# Patient Record
Sex: Male | Born: 1990 | Race: White | Hispanic: No | Marital: Married | Smoking: Never smoker
Health system: Southern US, Community
[De-identification: ages and names within clinical notes are randomized; demographics above are authoritative.]

## PROBLEM LIST (undated history)

## (undated) HISTORY — PX: TONSILLECTOMY: SUR1361

---
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
## 2013-12-11 ENCOUNTER — Emergency Department: Payer: Self-pay | Admitting: Emergency Medicine

## 2016-02-26 ENCOUNTER — Emergency Department: Payer: Self-pay

## 2016-02-26 ENCOUNTER — Emergency Department
Admission: EM | Admit: 2016-02-26 | Discharge: 2016-02-26 | Disposition: A | Discharge status: Home / Self Care | Payer: Self-pay | Attending: Emergency Medicine | Admitting: Emergency Medicine

## 2016-02-26 DIAGNOSIS — M25511 Pain in right shoulder: Secondary | ICD-10-CM | POA: Insufficient documentation

## 2016-02-26 DIAGNOSIS — G8929 Other chronic pain: Secondary | ICD-10-CM | POA: Insufficient documentation

## 2016-02-26 MED ORDER — IBUPROFEN 600 MG PO TABS: 600.0000 mg | ORAL_TABLET | Freq: Three times a day (TID) | ORAL | Status: DC | PRN

## 2016-02-26 NOTE — ED Notes (Signed)
Went to room to give patient his discharge instructions and patient, pt significant other and his belongings were all gone from the room.  Pt did not sign for discharge.

## 2016-02-26 NOTE — ED Notes (Signed)
Pt arrives to ER c/o right sided shoulder pain X 1 week. States hx of shoulder pain X 2 years ago and reinjured shoulder when picking something up at work. Pt states that he does not want to file workers comp.  Denies CP or SOB. Pt alert and oriented X4, active, cooperative, pt in NAD. RR even and unlabored, color WNL.

## 2016-02-26 NOTE — Discharge Instructions (Signed)
Follow-up with Dr. Martha ClanKrasinski for further evaluation of your right shoulder pain. Begin taking ibuprofen 3 times a day with food. You may also use ice or heat to your shoulder as needed for discomfort.

## 2016-02-26 NOTE — ED Provider Notes (Signed)
[facility] Emergency Department Provider Note  ____________________________________________  Time seen: Approximately 1:30 PM  I have reviewed the triage vital signs and the nursing notes.   HISTORY  Chief Complaint Shoulder Injury   HPI [name] is a 25 y.o. male history complaint of right-sided shoulder pain for 1 week. Patient states he has history of possible injury to his shoulder 2 years ago when he tried to do a pushup. He has not seen any orthopedist for this problem until today when he believes that he really injured his shoulder when he picked up something at work. Patient states that he does not want to file Workmen's Comp. He has not taken any over-the-counter medication for his shoulder pain. He states that the pain is "inside his shoulder". Patient has continued to use his arm without restriction. He denies any paresthesias and does not experience any increased pain with range of motion. His able to make a complete fist without any pain. Currently he rates his pain as 5 out of 10.   History reviewed. No pertinent past medical history.  There are no active problems to display for this patient.   History reviewed. No pertinent past surgical history.  Current Outpatient Rx  Name  Route  Sig  Dispense  Refill  . ibuprofen (ADVIL,MOTRIN) 600 MG tablet   Oral   Take 1 tablet (600 mg total) by mouth every 8 (eight) hours as needed.   30 tablet   0     Allergies Review of patient's allergies indicates no known allergies.  No family history on file.  Social History Social History  Substance Use Topics  . Smoking status: Never Smoker   . Smokeless tobacco: None  . Alcohol Use: No    Review of Systems Constitutional: No fever/chills Cardiovascular: Denies chest pain. Respiratory: Denies shortness of breath. Gastrointestinal:   No nausea, no vomiting.   Musculoskeletal: Negative for back pain.Positive right shoulder  pain. Skin: Negative for rash. Neurological: Negative for headaches, focal weakness or numbness.  10-point ROS otherwise negative.  ____________________________________________   PHYSICAL EXAM:  VITAL SIGNS: ED Triage Vitals  Enc Vitals Group     BP 02/26/16 1215 133/64 mmHg     Pulse Rate 02/26/16 1215 61     Resp 02/26/16 1215 18     Temp 02/26/16 1215 98.6 F (37 C)     Temp Source 02/26/16 1215 Oral     SpO2 02/26/16 1215 99 %     Weight 02/26/16 1215 200 lb (90.719 kg)     Height 02/26/16 1215 5\' 7"  (1.702 m)     Head Cir --      Peak Flow --      Pain Score 02/26/16 1209 5     Pain Loc --      Pain Edu? --      Excl. in GC? --     Constitutional: Alert and oriented. Well appearing and in no acute distress. Eyes: Conjunctivae are normal. PERRL. EOMI. Head: Atraumatic. Neck: No stridor.   Cardiovascular: Normal rate, regular rhythm. Grossly normal heart sounds.  Good peripheral circulation. Respiratory: Normal respiratory effort.  No retractions. Lungs CTAB. Musculoskeletal: Examination of the right shoulder fails to show any gross abnormality. Range of motion is unrestricted with no crepitus noted. There is some tenderness on palpation of the soft tissue right shoulder posteriorly. Trapezius muscle is nontender. There is no erythema or ecchymosis noted of the right shoulder. Patient is able to extend  his shoulder up above his head without any difficulty and abduction against resistance does not produce any pain. Neurologic:  Normal speech and language. No gross focal neurologic deficits are appreciated.  Skin:  Skin is warm, dry and intact. No rash noted. Psychiatric: Mood and affect are normal. Speech and behavior are normal.  ____________________________________________   LABS (all labs ordered are listed, but only abnormal results are displayed)  Labs Reviewed - No data to display  RADIOLOGY  Right shoulder per radiologist shows no apparent fracture  dislocation. I, Tommi Rumpshonda L Kenae Lindquist, personally viewed and evaluated these images (plain radiographs) as part of my medical decision making, as well as reviewing the written report by the radiologist. ____________________________________________   PROCEDURES  Procedure(s) performed: None  Critical Care performed: No  ____________________________________________   INITIAL IMPRESSION / ASSESSMENT AND PLAN / ED COURSE  Pertinent labs & imaging results that were available during my care of the patient were reviewed by me and considered in my medical decision making (see chart for details).  Patient was referred to Dr. Martha ClanKrasinski for further evaluation of his right shoulder pain. He is also given a prescription for ibuprofen 600 mg 3 times a day as he is not taking any medication prior to this. He is also encouraged to use ice or heat to his shoulder to see if this gives any relief as well. ____________________________________________   FINAL CLINICAL IMPRESSION(S) / ED DIAGNOSES  Final diagnoses:  Shoulder pain, acute, right  Chronic pain in shoulder, right      NEW MEDICATIONS STARTED DURING THIS VISIT:  New Prescriptions   IBUPROFEN (ADVIL,MOTRIN) 600 MG TABLET    Take 1 tablet (600 mg total) by mouth every 8 (eight) hours as needed.     Note:  This document was prepared using Dragon voice recognition software and may include unintentional dictation errors.    Tommi RumpsRhonda L Vandell Kun, PA-C 02/26/16 1426  Emily FilbertJonathan E Williams, MD 02/26/16 [phone]

## 2016-11-09 IMAGING — CR DG SHOULDER 2+V*R*
1 series · 3 of 3 positions shown · non-contrast
Comparison: None.

CLINICAL DATA: Chronic pain, more severe past week

EXAM:
RIGHT SHOULDER - 2+ VIEW

[Series 1: dg shoulder right · 0.14mm/px · 3 of 3 slices shown]
[im 1/3]
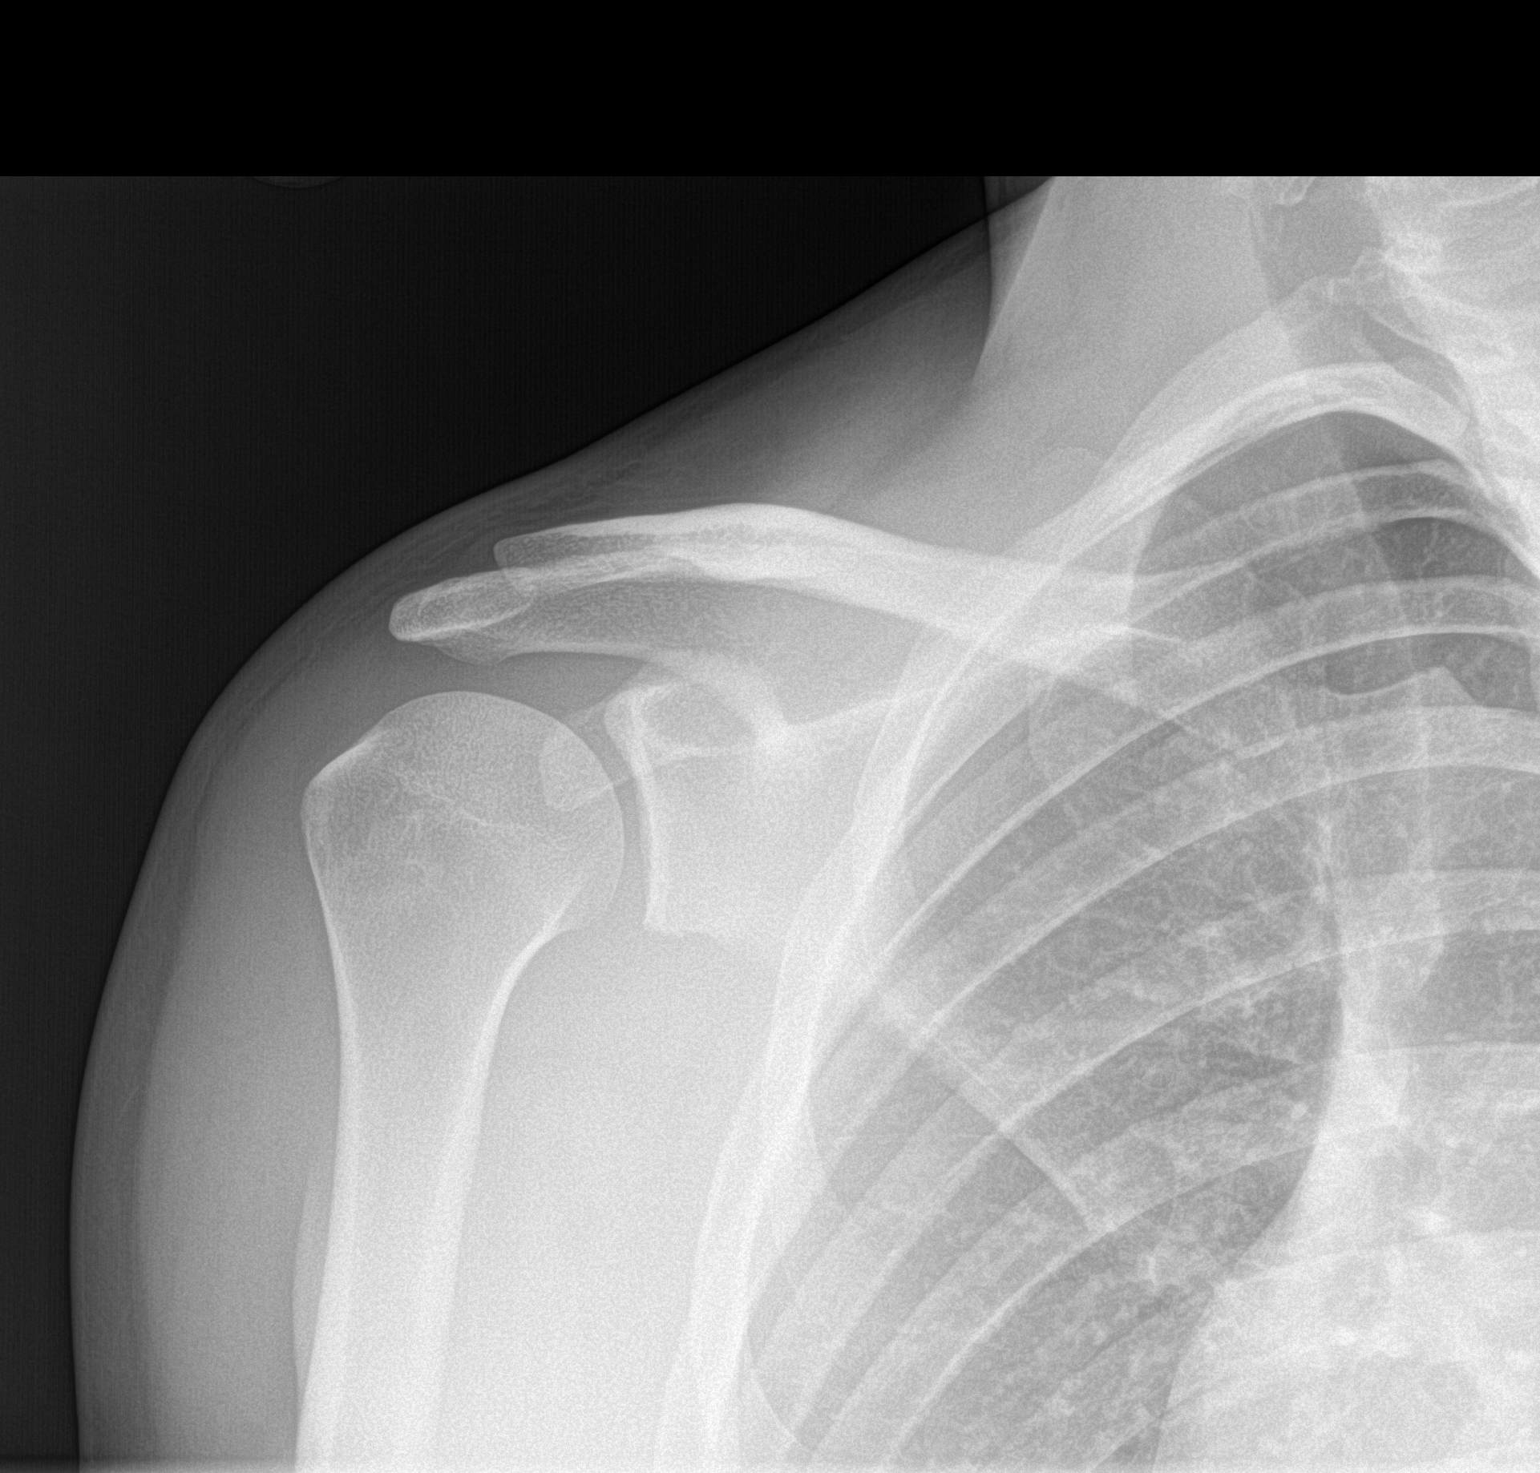
[im 2/3]
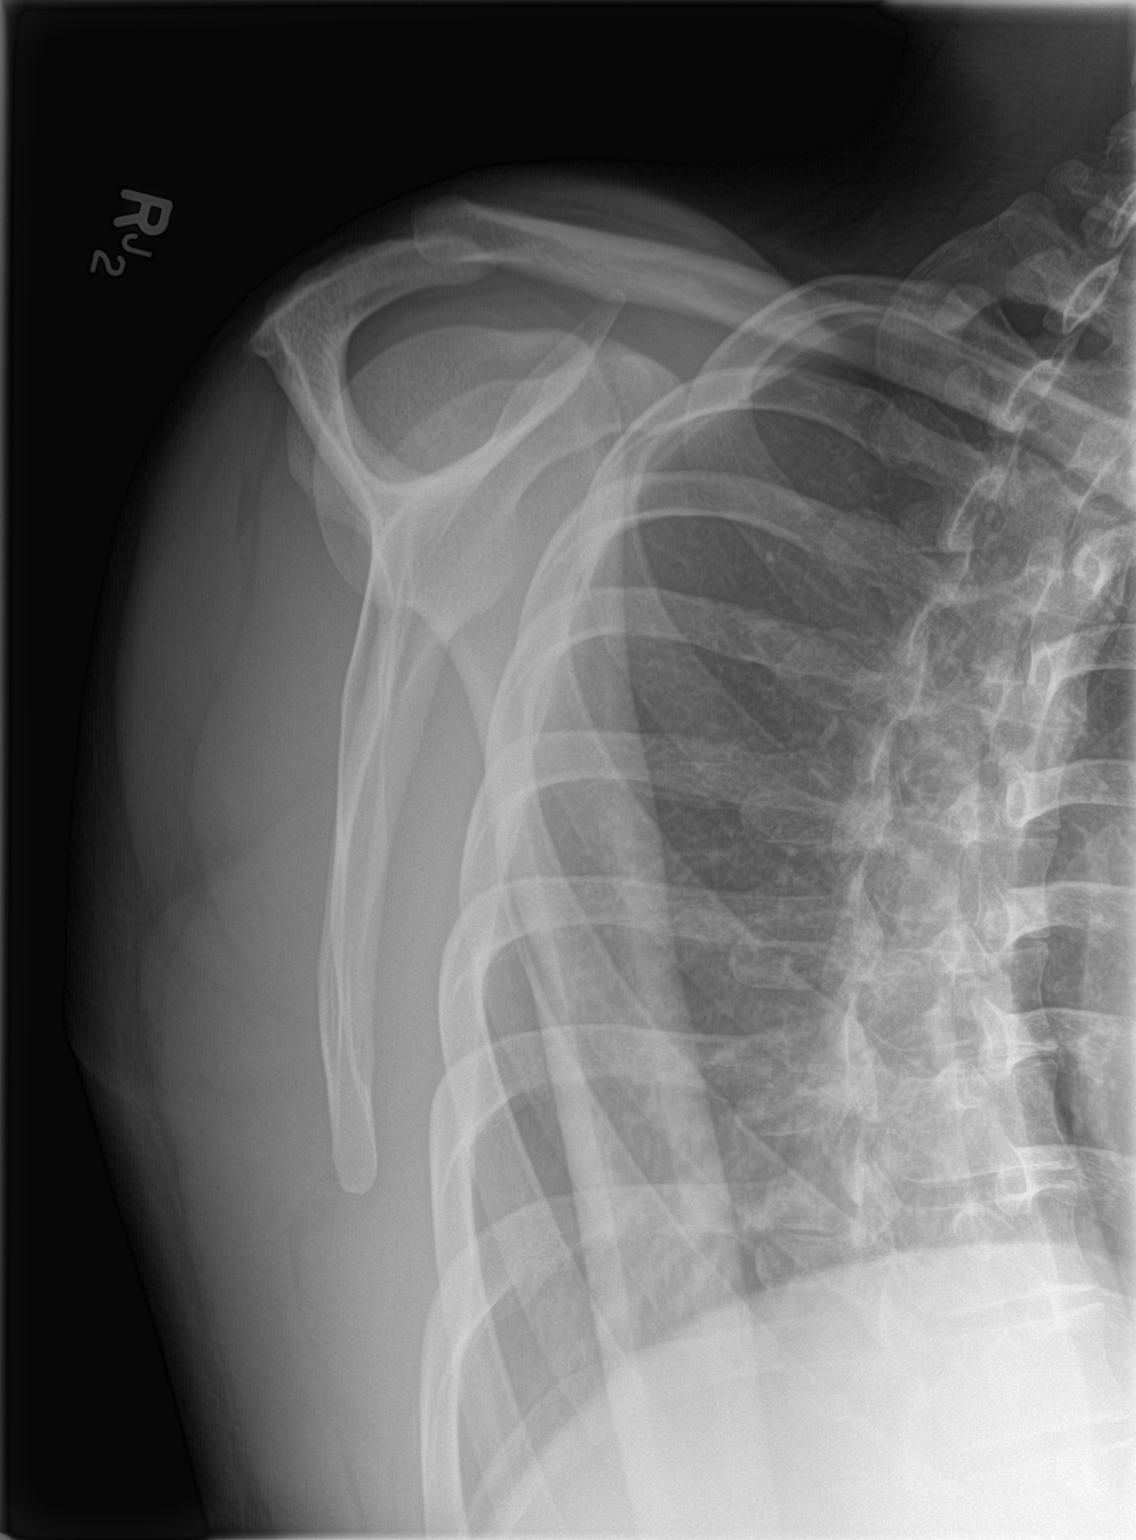
[im 3/3]
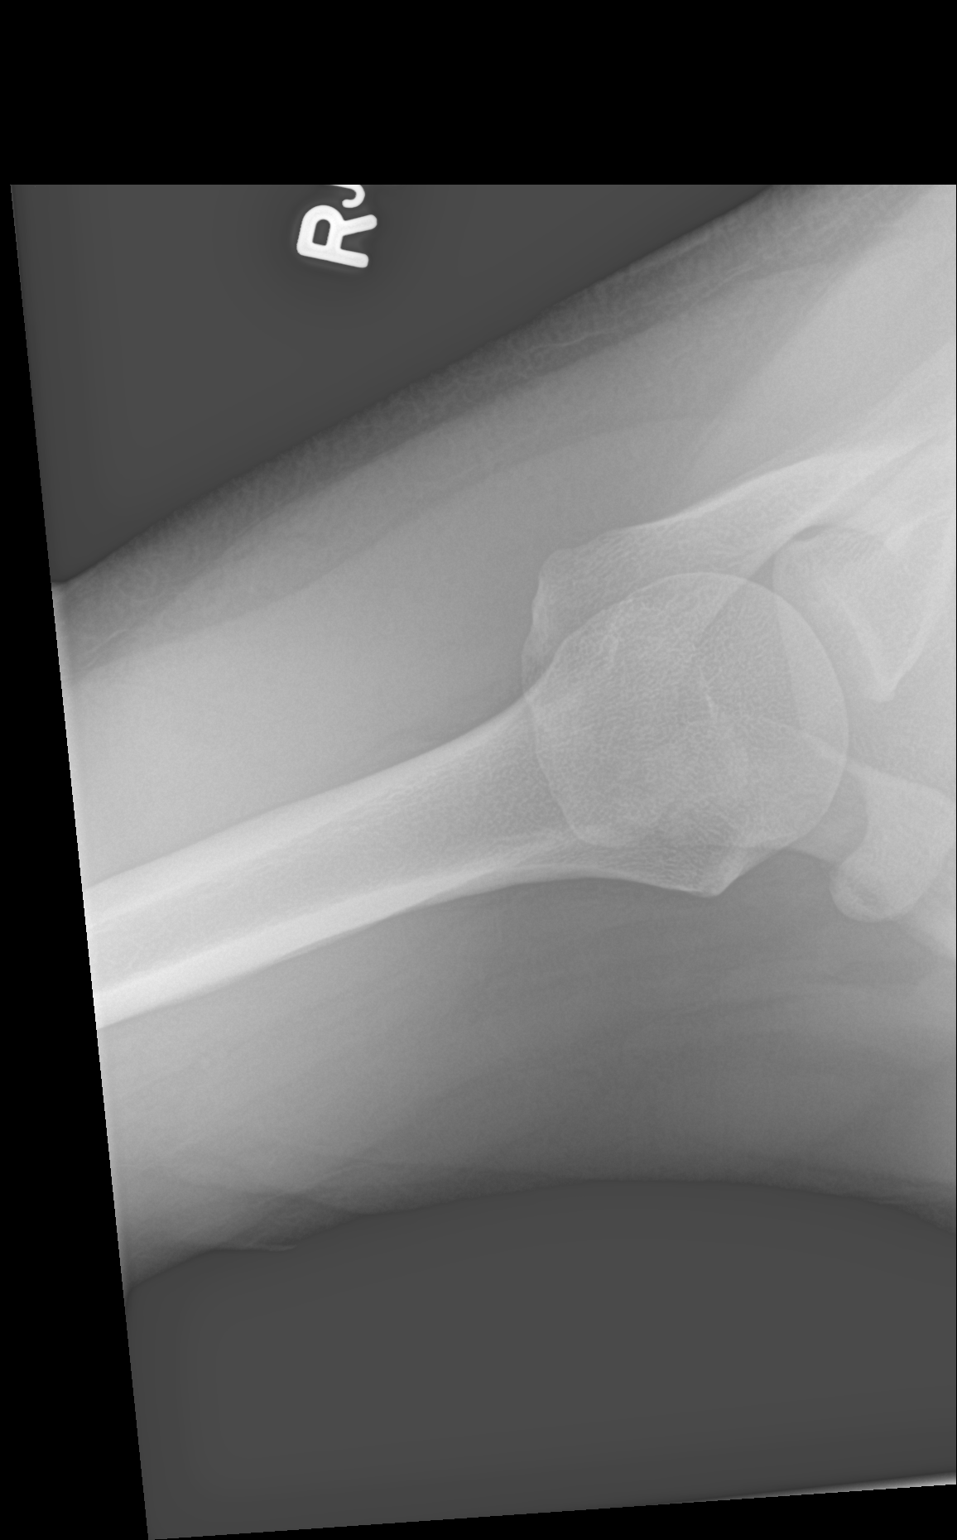

[3 of 3 positions shown; findings below may reference images not displayed]

FINDINGS: Oblique, Y scapular, and axillary images were obtained. There is no
demonstrable fracture or dislocation. The joint spaces appear
normal. No erosive change or intra-articular calcification.
Visualized right lung is clear.
IMPRESSION: No apparent fracture or dislocation.  No appreciable arthropathy.

## 2020-05-05 ENCOUNTER — Other Ambulatory Visit: Payer: Self-pay

## 2020-05-05 ENCOUNTER — Emergency Department: Payer: Medicaid Other

## 2020-05-05 ENCOUNTER — Emergency Department
Admission: EM | Admit: 2020-05-05 | Discharge: 2020-05-05 | Disposition: A | Discharge status: Home / Self Care | Payer: Medicaid Other | Attending: Emergency Medicine | Admitting: Emergency Medicine

## 2020-05-05 DIAGNOSIS — W60XXXA Contact with nonvenomous plant thorns and spines and sharp leaves, initial encounter: Secondary | ICD-10-CM | POA: Insufficient documentation

## 2020-05-05 DIAGNOSIS — S61233A Puncture wound without foreign body of left middle finger without damage to nail, initial encounter: Secondary | ICD-10-CM

## 2020-05-05 DIAGNOSIS — Z23 Encounter for immunization: Secondary | ICD-10-CM | POA: Insufficient documentation

## 2020-05-05 MED ORDER — TETANUS-DIPHTH-ACELL PERTUSSIS 5-2.5-18.5 LF-MCG/0.5 IM SUSP
0.5000 mL | Freq: Once | INTRAMUSCULAR | Status: AC
  Administered 2020-05-05: 0.5 mL via INTRAMUSCULAR
  Filled 2020-05-05: qty 0.5

## 2020-05-05 MED ORDER — CEPHALEXIN 500 MG PO CAPS: 500.0000 mg | ORAL_CAPSULE | Freq: Four times a day (QID) | ORAL | 0 refills | Status: DC

## 2020-05-05 NOTE — ED Notes (Signed)
See triage note   States he got a rose thorn in left middle finger yesterday  Was able to removed the thorn but now finger is swollen and tender

## 2020-05-05 NOTE — ED Provider Notes (Signed)
[facility] Emergency Department Provider Note  ____________________________________________   First MD Initiated Contact with Patient 05/05/20 1355     (approximate)  I have reviewed the triage vital signs and the nursing notes.   HISTORY  Chief Complaint Hand Pain   HPI [name] is a 29 y.o. male presents to the ED with complaint of left third finger pain.  Patient states that a rose bush thorn punctured his finger yesterday.  He believes that he was able to remove the thorn entirely.  Since that time his finger has become swollen, tender and slightly red.  He denies any fever or chills.  He also is uncertain as to when his last tetanus was but well over 6 years.  He rates his pain as 7 out of 10.       History reviewed. No pertinent past medical history.  There are no problems to display for this patient.   History reviewed. No pertinent surgical history.  Prior to Admission medications   Medication Sig Start Date End Date Taking? Authorizing Provider  cephALEXin (KEFLEX) 500 MG capsule Take 1 capsule (500 mg total) by mouth 4 (four) times daily. 05/05/20   Tommi Rumps, PA-C  ibuprofen (ADVIL,MOTRIN) 600 MG tablet Take 1 tablet (600 mg total) by mouth every 8 (eight) hours as needed. 02/26/16   Tommi Rumps, PA-C    Allergies Patient has no known allergies.  No family history on file.  Social History Social History   Tobacco Use  . Smoking status: Never Smoker  . Smokeless tobacco: Never Used  Substance Use Topics  . Alcohol use: No  . Drug use: Not Currently    Review of Systems Constitutional: No fever/chills Eyes: No visual changes. ENT: No sore throat. Cardiovascular: Denies chest pain. Respiratory: Denies shortness of breath. Musculoskeletal: Positive for left third finger pain. Skin: Positive for puncture wound with erythema. Neurological: Negative for headaches, focal weakness or  numbness. ____________________________________________   PHYSICAL EXAM:  VITAL SIGNS: ED Triage Vitals [05/05/20 1323]  Enc Vitals Group     BP 137/86     Pulse Rate 70     Resp 16     Temp 98.4 F (36.9 C)     Temp Source Oral     SpO2 98 %     Weight 190 lb (86.2 kg)     Height 5\' 7"  (1.702 m)     Head Circumference      Peak Flow      Pain Score 7     Pain Loc      Pain Edu?      Excl. in GC?     Constitutional: Alert and oriented. Well appearing and in no acute distress. Eyes: Conjunctivae are normal.  Head: Atraumatic. Neck: No stridor.   Cardiovascular: Normal rate, regular rhythm. Grossly normal heart sounds.  Good peripheral circulation. Respiratory: Normal respiratory effort.  No retractions. Lungs CTAB. Musculoskeletal: Examination of the left third digit PIP joint there is some soft tissue edema and mild erythema generalized.  There is no active drainage.  The puncture wound from the [place] is on the lateral aspect.  At this time patient is able to flex and extend.  Capillary refills less than 3 seconds.  No streaking is noted. Neurologic:  Normal speech and language. No gross focal neurologic deficits are appreciated. No gait instability. Skin:  Skin is warm, dry. Psychiatric: Mood and affect are normal. Speech and behavior are normal.  ____________________________________________   LABS (all labs ordered are listed, but only abnormal results are displayed)  Labs Reviewed - No data to display RADIOLOGY   Official radiology report(s): DG Finger Middle Left  Result Date: 05/05/2020 CLINICAL DATA:  Puncture wound. EXAM: LEFT MIDDLE FINGER 2+V COMPARISON:  None. FINDINGS: There is no evidence of fracture or dislocation. There is no evidence of arthropathy or other focal bone abnormality. Diffuse soft swelling of the left third finger. IMPRESSION: 1. No acute fracture or dislocation identified about the left third finger. 2. Diffuse soft tissue swelling of the  left third finger. Electronically Signed   By: Ted Mcalpine M.D.   On: 05/05/2020 15:42    ____________________________________________   PROCEDURES  Procedure(s) performed (including Critical Care):  Procedures   ____________________________________________   INITIAL IMPRESSION / ASSESSMENT AND PLAN / ED COURSE  As part of my medical decision making, I reviewed the following data within the electronic MEDICAL RECORD NUMBER Notes from prior ED visits and [place] Controlled Substance Database  28 year old male presents to the ED with complaint of redness from a puncture wound that occurred while he was cleaning up around a rose bush.  Patient is able to flex and extend digit and there is minimal erythema at this time.  No purulent drainage is noted.  Patient was given a tetanus booster.  He was started on Keflex 500 mg 4 times daily for 7 days.  He is to use warm compresses or soak in warm water.  He is aware that he needs to return to the emergency department if any worsening of the infection such as red streaks going up his hand. ____________________________________________   FINAL CLINICAL IMPRESSION(S) / ED DIAGNOSES  Final diagnoses:  Puncture wound of left middle finger     ED Discharge Orders         Ordered    cephALEXin (KEFLEX) 500 MG capsule  4 times daily        05/05/20 1547           Note:  This document was prepared using Dragon voice recognition software and may include unintentional dictation errors.    Tommi Rumps, PA-C 05/05/20 1653    Minna Antis, MD 05/15/20 [phone]

## 2020-05-05 NOTE — ED Triage Notes (Signed)
Pt states he was cleaning up some brush yesterday and now has swelling and redness to the left 3rd finger.

## 2020-05-05 NOTE — Discharge Instructions (Signed)
You may follow-up with your primary care provider if any continued problems.  If any worsening of your symptoms return to the emergency department for evaluation.  Begin taking Keflex 500 mg 4 times daily for the next 7 days.  Warm, moist compresses to the hand frequently or soak in warm water.  You may also take Tylenol or ibuprofen as needed for pain.  Elevate if needed for swelling.

## 2021-01-17 IMAGING — DX DG FINGER MIDDLE 2+V*L*
3 series · 3 of 3 positions shown · non-contrast
Comparison: None.

CLINICAL DATA: Puncture wound.

EXAM:
LEFT MIDDLE FINGER 2+V

[finger ap]
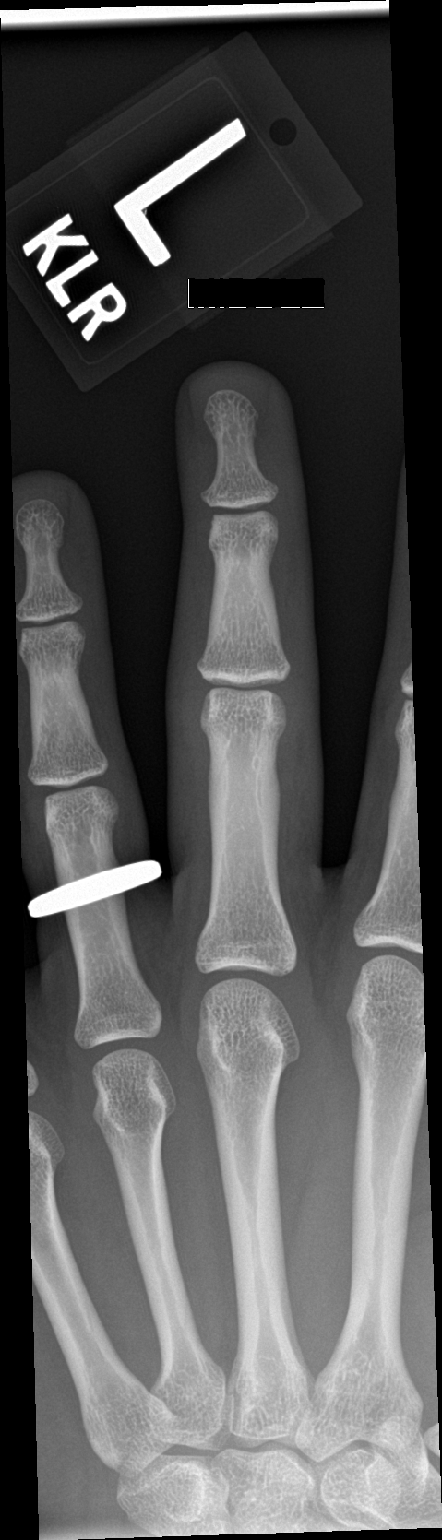

[finger obl]
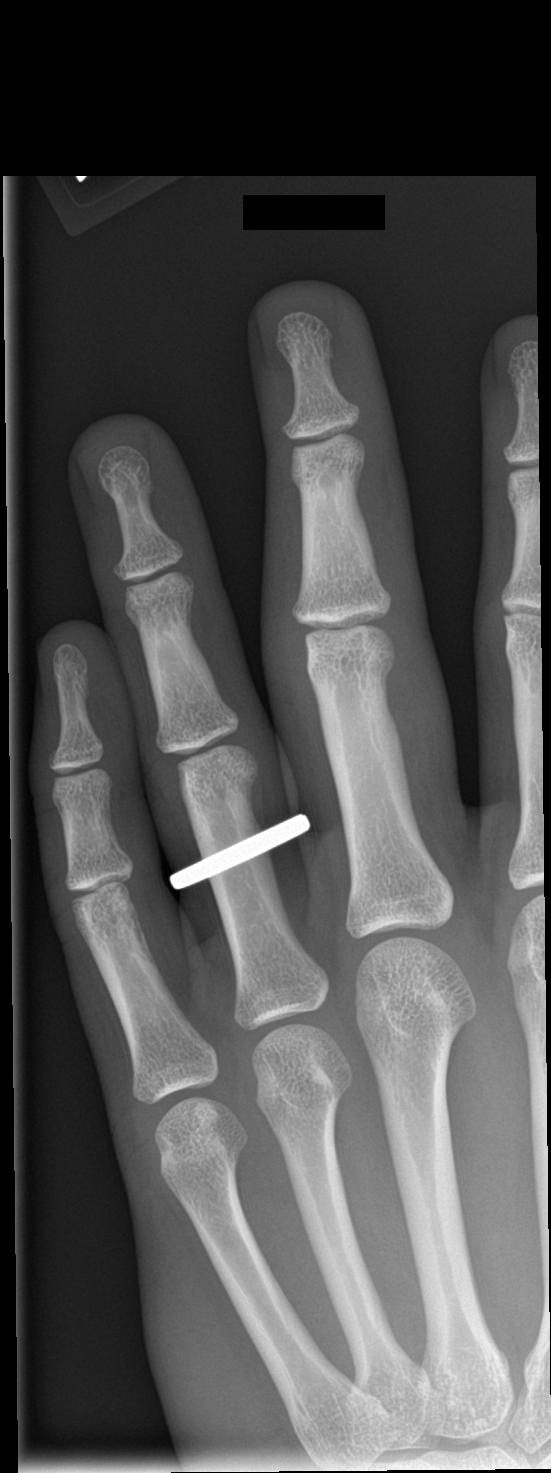

[finger lat]
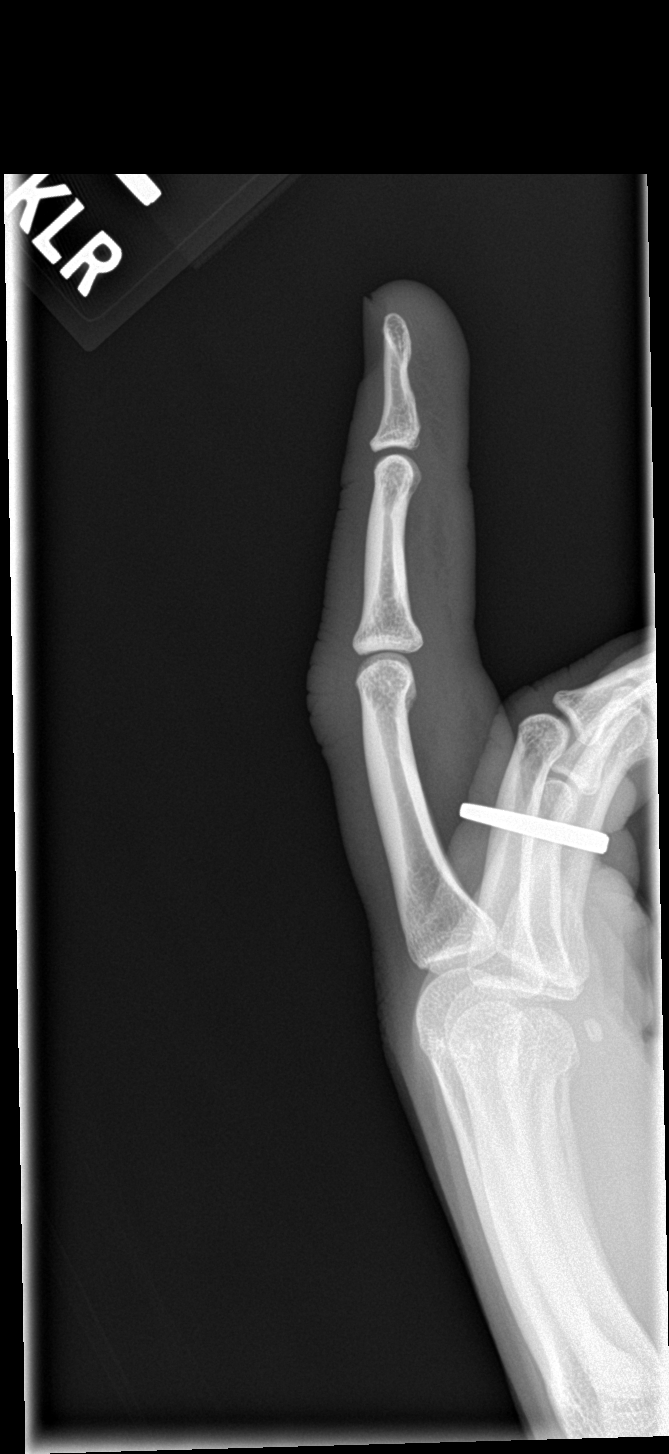

[3 of 3 positions shown; findings below may reference images not displayed]

FINDINGS: There is no evidence of fracture or dislocation. There is no
evidence of arthropathy or other focal bone abnormality. Diffuse
soft swelling of the left third finger.
IMPRESSION: 1. No acute fracture or dislocation identified about the left third
finger.
2. Diffuse soft tissue swelling of the left third finger.

## 2021-07-29 DIAGNOSIS — R112 Nausea with vomiting, unspecified: Secondary | ICD-10-CM | POA: Diagnosis not present

## 2021-07-29 DIAGNOSIS — K529 Noninfective gastroenteritis and colitis, unspecified: Secondary | ICD-10-CM | POA: Diagnosis not present

## 2021-09-05 DIAGNOSIS — Z20822 Contact with and (suspected) exposure to covid-19: Secondary | ICD-10-CM | POA: Diagnosis not present

## 2023-02-14 DIAGNOSIS — R590 Localized enlarged lymph nodes: Secondary | ICD-10-CM | POA: Diagnosis not present

## 2023-02-15 ENCOUNTER — Other Ambulatory Visit (HOSPITAL_COMMUNITY): Payer: Self-pay | Admitting: Nurse Practitioner

## 2023-02-15 DIAGNOSIS — R590 Localized enlarged lymph nodes: Secondary | ICD-10-CM

## 2023-02-18 ENCOUNTER — Ambulatory Visit (HOSPITAL_COMMUNITY)
Admission: RE | Admit: 2023-02-18 | Discharge: 2023-02-18 | Disposition: A | Discharge status: Home / Self Care | Payer: Medicaid Other | Source: Ambulatory Visit | Attending: Nurse Practitioner | Admitting: Nurse Practitioner

## 2023-02-18 DIAGNOSIS — R590 Localized enlarged lymph nodes: Secondary | ICD-10-CM | POA: Insufficient documentation

## 2023-03-11 DIAGNOSIS — R09A2 Foreign body sensation, throat: Secondary | ICD-10-CM | POA: Diagnosis not present

## 2023-03-11 DIAGNOSIS — K219 Gastro-esophageal reflux disease without esophagitis: Secondary | ICD-10-CM | POA: Diagnosis not present

## 2023-03-11 DIAGNOSIS — J342 Deviated nasal septum: Secondary | ICD-10-CM | POA: Diagnosis not present

## 2023-03-11 DIAGNOSIS — J343 Hypertrophy of nasal turbinates: Secondary | ICD-10-CM | POA: Diagnosis not present

## 2023-03-17 ENCOUNTER — Ambulatory Visit: Payer: Medicaid Other | Admitting: Family Medicine

## 2023-07-12 ENCOUNTER — Other Ambulatory Visit: Payer: Self-pay

## 2023-07-12 ENCOUNTER — Ambulatory Visit
Admission: RE | Admit: 2023-07-12 | Discharge: 2023-07-12 | Disposition: A | Discharge status: Home / Self Care | Payer: Medicaid Other | Source: Ambulatory Visit | Attending: Family Medicine | Admitting: Family Medicine

## 2023-07-12 VITALS — BP 125/77 | HR 67 | Temp 97.8°F | Resp 20

## 2023-07-12 DIAGNOSIS — R519 Headache, unspecified: Secondary | ICD-10-CM | POA: Diagnosis not present

## 2023-07-12 DIAGNOSIS — R002 Palpitations: Secondary | ICD-10-CM | POA: Diagnosis not present

## 2023-07-12 MED ORDER — KETOROLAC TROMETHAMINE 30 MG/ML IJ SOLN
30.0000 mg | Freq: Once | INTRAMUSCULAR | Status: AC
  Administered 2023-07-12: 30 mg via INTRAMUSCULAR

## 2023-07-12 NOTE — Discharge Instructions (Signed)
We given you a shot of Toradol which is a pain inflammation medication to help with your headache.  Make sure to stay well-hydrated, get lots of rest and follow-up if symptoms worsen and do not resolve.  Regarding your chest symptoms that you are describing, I recommend that you follow-up with cardiology for further evaluation.  You may call directly and schedule this appointment.  I have placed the information above.  Follow-up at anytime for worsening symptoms.

## 2023-07-12 NOTE — ED Triage Notes (Addendum)
Pt reports headache x3 days. Pt reports light and sound sensitivity, intermittent blurred vision and left arm numbness. Denies hx of similar.

## 2023-07-12 NOTE — ED Provider Notes (Signed)
[facility]    CSN: [id number] Arrival date & time: 07/12/23  1130      History   Chief Complaint Chief Complaint  Patient presents with   Headache    3 day headache and strange heart feeling - Entered by patient    HPI [name] is a 32 y.o. male.   Patient presenting today with 3-day history of headache, light and sound sensitivity, intermittent blurred vision.  Denies head injury, vomiting, dizziness, mental status changes, extremity weakness.  Trying over-the-counter pain relievers with minimal relief.  No past history of migraines.  Does note he is under a significant mount of stress currently.  He also describes some abnormal chest sensations such as palpitations with activity, and states that his heart feels different when it is beating more like a sponge is beating in his chest rather than his heart.  He notes that he used to drink a lot of energy drinks but since stopped since the symptoms have started.  Denies shortness of breath, dizziness, syncopal episodes, known history of chronic heart issues, history of early cardiac death in his family.  Not tried anything for the symptoms.   No past medical history on file.  There are no problems to display for this patient.  No past surgical history on file.   Home Medications    Prior to Admission medications   Medication Sig Start Date End Date Taking? Authorizing Provider  cephALEXin (KEFLEX) 500 MG capsule Take 1 capsule (500 mg total) by mouth 4 (four) times daily. 05/05/20   Tommi Rumps, PA-C  ibuprofen (ADVIL,MOTRIN) 600 MG tablet Take 1 tablet (600 mg total) by mouth every 8 (eight) hours as needed. 02/26/16   Tommi Rumps, PA-C   Family History No family history on file.  Social History Social History   Tobacco Use   Smoking status: Never   Smokeless tobacco: Never  Substance Use Topics   Alcohol use: No   Drug use: Not Currently     Allergies   Patient has no known  allergies.   Review of Systems Review of Systems PER HPI  Physical Exam Triage Vital Signs ED Triage Vitals  Encounter Vitals Group     BP 07/12/23 1152 125/77     Systolic BP Percentile --      Diastolic BP Percentile --      Pulse Rate 07/12/23 1152 67     Resp 07/12/23 1152 20     Temp 07/12/23 1152 97.8 F (36.6 C)     Temp Source 07/12/23 1152 Oral     SpO2 07/12/23 1152 95 %     Weight --      Height --      Head Circumference --      Peak Flow --      Pain Score 07/12/23 1151 3     Pain Loc --      Pain Education --      Exclude from Growth Chart --    No data found.  Updated Vital Signs BP 125/77 (BP Location: Right Arm)   Pulse 67   Temp 97.8 F (36.6 C) (Oral)   Resp 20   SpO2 95%   Visual Acuity Right Eye Distance: 20/20 Left Eye Distance: 20/20 Bilateral Distance: 20/20  Right Eye Near:   Left Eye Near:    Bilateral Near:     Physical Exam Vitals and nursing note reviewed.  Constitutional:      Appearance: Normal  appearance.  HENT:     Head: Atraumatic.     Mouth/Throat:     Mouth: Mucous membranes are moist.     Pharynx: Oropharynx is clear.  Eyes:     Extraocular Movements: Extraocular movements intact.     Conjunctiva/sclera: Conjunctivae normal.     Pupils: Pupils are equal, round, and reactive to light.  Cardiovascular:     Rate and Rhythm: Normal rate and regular rhythm.     Heart sounds: Normal heart sounds.  Pulmonary:     Effort: Pulmonary effort is normal.     Breath sounds: Normal breath sounds. No wheezing or rales.  Musculoskeletal:        General: Normal range of motion.     Cervical back: Normal range of motion and neck supple.  Skin:    General: Skin is warm and dry.  Neurological:     General: No focal deficit present.     Mental Status: He is oriented to person, place, and time.     Cranial Nerves: No cranial nerve deficit.     Motor: No weakness.     Gait: Gait normal.  Psychiatric:        Mood and Affect:  Mood normal.        Thought Content: Thought content normal.        Judgment: Judgment normal.    UC Treatments / Results  Labs (all labs ordered are listed, but only abnormal results are displayed) Labs Reviewed - No data to display  EKG  Radiology No results found.  Procedures Procedures (including critical care time)  Medications Ordered in UC Medications  ketorolac (TORADOL) 30 MG/ML injection 30 mg (30 mg Intramuscular Given 07/12/23 1223)    Initial Impression / Assessment and Plan / UC Course  I have reviewed the triage vital signs and the nursing notes.  Pertinent labs & imaging results that were available during my care of the patient were reviewed by me and considered in my medical decision making (see chart for details).     Vital signs and exam very reassuring today with no obvious abnormalities.  No focal neurologic deficits related to the headache.  Suspect migraine type headache, treat with IM Toradol, supportive over-the-counter medications and home care.  Close PCP follow-up for recheck.  Regarding his palpitations and abnormal heart sensations over the last few months, unclear etiology of the symptoms.  EKG today shows sinus bradycardia at 52 bpm with no ST elevations but some nonspecific changes.  No past EKG for comparison available.  Recommending cardiology follow-up for further reassurance and evaluation.  Work note given.  Return for worsening symptoms.  Final Clinical Impressions(s) / UC Diagnoses   Final diagnoses:  Acute nonintractable headache, unspecified headache type  Palpitations     Discharge Instructions      We given you a shot of Toradol which is a pain inflammation medication to help with your headache.  Make sure to stay well-hydrated, get lots of rest and follow-up if symptoms worsen and do not resolve.  Regarding your chest symptoms that you are describing, I recommend that you follow-up with cardiology for further evaluation.  You may  call directly and schedule this appointment.  I have placed the information above.  Follow-up at anytime for worsening symptoms.     ED Prescriptions   None    PDMP not reviewed this encounter.   Particia Nearing, [place] 07/12/23 1542

## 2023-07-18 ENCOUNTER — Emergency Department (HOSPITAL_COMMUNITY)
Admission: EM | Admit: 2023-07-18 | Discharge: 2023-07-18 | Discharge status: Against Medical Advice | Payer: Medicaid Other | Attending: Emergency Medicine | Admitting: Emergency Medicine

## 2023-07-18 ENCOUNTER — Emergency Department (HOSPITAL_COMMUNITY): Payer: Medicaid Other

## 2023-07-18 ENCOUNTER — Encounter (HOSPITAL_COMMUNITY): Payer: Self-pay | Admitting: *Deleted

## 2023-07-18 ENCOUNTER — Other Ambulatory Visit: Payer: Self-pay

## 2023-07-18 DIAGNOSIS — R0789 Other chest pain: Secondary | ICD-10-CM | POA: Diagnosis not present

## 2023-07-18 DIAGNOSIS — R0602 Shortness of breath: Secondary | ICD-10-CM | POA: Insufficient documentation

## 2023-07-18 DIAGNOSIS — R002 Palpitations: Secondary | ICD-10-CM | POA: Diagnosis not present

## 2023-07-18 DIAGNOSIS — R079 Chest pain, unspecified: Secondary | ICD-10-CM | POA: Diagnosis not present

## 2023-07-18 DIAGNOSIS — Z5321 Procedure and treatment not carried out due to patient leaving prior to being seen by health care provider: Secondary | ICD-10-CM | POA: Insufficient documentation

## 2023-07-18 LAB — CBC
HCT: 44.8 % (ref 39.0–52.0)
Hemoglobin: 15.6 g/dL (ref 13.0–17.0)
MCH: 31.8 pg (ref 26.0–34.0)
MCHC: 34.8 g/dL (ref 30.0–36.0)
MCV: 91.2 fL (ref 80.0–100.0)
Platelets: 228 10*3/uL (ref 150–400)
RBC: 4.91 MIL/uL (ref 4.22–5.81)
RDW: 11.9 % (ref 11.5–15.5)
WBC: 9.9 10*3/uL (ref 4.0–10.5)
nRBC: 0 % (ref 0.0–0.2)

## 2023-07-18 LAB — TROPONIN I (HIGH SENSITIVITY)
Troponin I (High Sensitivity): 14 ng/L (ref <18)
Troponin I (High Sensitivity): 14 ng/L (ref <18)

## 2023-07-18 LAB — BASIC METABOLIC PANEL
Anion gap: 9 (ref 5–15)
BUN: 17 mg/dL (ref 6–20)
CO2: 23 mmol/L (ref 22–32)
Calcium: 9 mg/dL (ref 8.9–10.3)
Chloride: 103 mmol/L (ref 98–111)
Creatinine, Ser: 1.01 mg/dL (ref 0.61–1.24)
GFR, Estimated: >60 mL/min (ref >60)
Glucose, Bld: 87 mg/dL (ref 70–99)
Potassium: 3.8 mmol/L (ref 3.5–5.1)
Sodium: 135 mmol/L (ref 135–145)

## 2023-07-18 NOTE — ED Triage Notes (Signed)
Pt states he was seen at urgent care last week for possible palpitations and was told he had an abnormal EKG and that if his sx got worse to come to ED  Pt states he was at work today and started feeling sob and chest tightness

## 2023-08-18 DIAGNOSIS — R002 Palpitations: Secondary | ICD-10-CM | POA: Insufficient documentation

## 2023-08-18 NOTE — Progress Notes (Signed)
Cardiology Office Note   Date:  08/19/2023   ID:  [name], DOB Mar 21, 1991, MRN [id number]  PCP:  Patient, No Pcp Per  Cardiologist:   Rollene Rotunda, MD Referring:  No ref. provider found  Chief Complaint  Patient presents with   Shortness of Breath      History of Present Illness: [name] is a 32 y.o. male who presents for evaluation of a murmur.  He was sent after going to urgent care.   He was in actually the emergency room with weakness dizziness and headache.  He was told his EKG was abnormal and he had a murmur.  He has not been told about the murmur before but he does not really go to the doctor.  He does not have any past medical history.  He has been getting feeling like his heart was beating hard on out squeezing appropriately.  He really cannot quantify or qualify this.  He does describe shortness of breath.  He says he is short of breath trying to walk a flight of stairs.  He is not short of breath at rest at night and has no PND or orthopnea.  He has not had any presyncope or syncope.  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  He recovers fairly quickly.  He is not describing chest pressure, neck or arm discomfort.  He has not had weight gain or edema.   History reviewed. No pertinent past medical history.  Past Surgical History:  Procedure Laterality Date   TONSILLECTOMY       Current Outpatient Medications  Medication Sig Dispense Refill   ibuprofen (ADVIL,MOTRIN) 600 MG tablet Take 1 tablet (600 mg total) by mouth every 8 (eight) hours as needed. 30 tablet 0   cephALEXin (KEFLEX) 500 MG capsule Take 1 capsule (500 mg total) by mouth 4 (four) times daily. 28 capsule 0   No current facility-administered medications for this visit.    Allergies:   Patient has no known allergies.    Social History:  The patient  reports that he has never smoked. He has never used smokeless  tobacco. He reports that he does not currently use drugs. He reports that he does not drink alcohol.   Family History:  The patient's family history includes Hypertension in his mother.    ROS:  Please see the history of present illness.   Otherwise, review of systems are positive for none.   All other systems are reviewed and negative.    PHYSICAL EXAM: VS:  BP 100/64 (BP Location: Right Arm, Patient Position: Sitting)   Pulse 75   Ht 5\' 8"  (1.727 m)   Wt 170 lb (77.1 kg)   SpO2 96%   BMI 25.85 kg/m  , BMI Body mass index is 25.85 kg/m.  BP the same in both arms.  GENERAL:  Well appearing HEENT:  Pupils equal round and reactive, fundi not visualized, oral mucosa unremarkable NECK:  No jugular venous distention, waveform within normal limits, carotid upstroke brisk and symmetric, no bruits, no thyromegaly LYMPHATICS:  No cervical, inguinal adenopathy LUNGS:  Clear to auscultation bilaterally BACK:  No CVA tenderness CHEST:  Unremarkable HEART:  PMI not displaced or sustained,S1 and S2 within normal limits, no S3, no S4, no clicks, no rubs, 3 out of 6 systolic murmur at the apex with radiation at the aortic outflow tract and slightly to  the axilla increasing with the strain phase of Valsalva, no no diastolic murmurs ABD:  Flat, positive bowel sounds normal in frequency in pitch, no bruits, no rebound, no guarding, no midline pulsatile mass, no hepatomegaly, no splenomegaly EXT:  2 plus pulses throughout, no edema, no cyanosis no clubbing SKIN:  No rashes no nodules NEURO:  Cranial nerves II through XII grossly intact, motor grossly intact throughout PSYCH:  Cognitively intact, oriented to person place and time    EKG: Sinus rhythm, rate 69, axis within normal limits, intervals within normal limits, left ventricular hypertrophy by voltage criteria, questionable lateral infarct, possible right atrial enlargement, repolarization changes.    Recent Labs: 07/18/2023: BUN 17;  Creatinine, Ser 1.01; Hemoglobin 15.6; Platelets 228; Potassium 3.8; Sodium 135    Lipid Panel No results found for: "CHOL", "TRIG", "HDL", "CHOLHDL", "VLDL", "LDLCALC", "LDLDIRECT"    Wt Readings from Last 3 Encounters:  08/19/23 170 lb (77.1 kg)  07/18/23 160 lb (72.6 kg)  05/05/20 190 lb (86.2 kg)      Other studies Reviewed: Additional studies/ records that were reviewed today include: ED records.. Review of the above records demonstrates:  Please see elsewhere in the note.     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  Management will be based on these results.  Palpitations: He has a hard time quantifying her qualifying this.  I will get a 3-day ZIO.   Current medicines are reviewed at length with the patient today.  The patient started concerns regarding medicines.  The following changes have been made:  no change  Labs/ tests ordered today include:   Orders Placed This Encounter  Procedures   LONG TERM MONITOR (3-14 DAYS)   ECHOCARDIOGRAM COMPLETE     Disposition:   FU with with me after the above studies.      Signed, Rollene Rotunda, MD  08/19/2023 9:13 AM    [facility]

## 2023-08-19 ENCOUNTER — Encounter: Payer: Self-pay | Admitting: Cardiology

## 2023-08-19 ENCOUNTER — Ambulatory Visit (INDEPENDENT_AMBULATORY_CARE_PROVIDER_SITE_OTHER): Payer: Medicaid Other

## 2023-08-19 ENCOUNTER — Ambulatory Visit: Payer: Medicaid Other | Attending: Cardiology | Admitting: Cardiology

## 2023-08-19 VITALS — BP 100/64 | HR 75 | Ht 68.0 in | Wt 170.0 lb

## 2023-08-19 DIAGNOSIS — R002 Palpitations: Secondary | ICD-10-CM

## 2023-08-19 DIAGNOSIS — R011 Cardiac murmur, unspecified: Secondary | ICD-10-CM | POA: Diagnosis not present

## 2023-08-19 NOTE — Patient Instructions (Addendum)
Medication Instructions:  No changes. *If you need a refill on your cardiac medications before your next appointment, please call your pharmacy*   Testing/Procedures: Your physician has requested that you have an echocardiogram. Echocardiography is a painless test that uses sound waves to create images of your heart. It provides your doctor with information about the size and shape of your heart and how well your heart's chambers and valves are working. This procedure takes approximately one hour. There are no restrictions for this procedure. Please do NOT wear cologne, perfume, aftershave, or lotions (deodorant is allowed). Please arrive 15 minutes prior to your appointment time.  [address].  Please note: We ask at that you not bring children with you during ultrasound (echo/ vascular) testing. Due to room size and safety concerns, children are not allowed in the ultrasound rooms during exams. Our front office staff cannot provide observation of children in our lobby area while testing is being conducted. An adult accompanying a patient to their appointment will only be allowed in the ultrasound room at the discretion of the ultrasound technician under special circumstances. We apologize for any inconvenience.   ZIO XT- Long Term Monitor Instructions  Your physician has requested you wear a ZIO patch monitor for 3 days.  This is a single patch monitor. [employer] supplies one patch monitor per enrollment. Additional stickers are not available. Please do not apply patch if you will be having a Nuclear Stress Test,  Echocardiogram, Cardiac CT, MRI, or Chest Xray during the period you would be wearing the  monitor. The patch cannot be worn during these tests. You cannot remove and re-apply the  ZIO XT patch monitor.  Your ZIO patch monitor will be mailed 3 day USPS to your address on file. It may take 3-5 days  to receive your monitor after you have been enrolled.  Once you have received  your monitor, please review the enclosed instructions. Your monitor  has already been registered assigning a specific monitor serial # to you.  Billing and Patient Assistance Program Information  We have supplied [employer] with any of your insurance information on file for billing purposes. [employer] offers a sliding scale Patient Assistance Program for patients that do not have  insurance, or whose insurance does not completely cover the cost of the ZIO monitor.  You must apply for the Patient Assistance Program to qualify for this discounted rate.  To apply, please call [employer] at [phone], select option 4, select option 2, ask to apply for  Patient Assistance Program. Meredeth Ide will ask your household income, and how many people  are in your household. They will quote your out-of-pocket cost based on that information.  [employer] will also be able to set up a 35-month, interest-free payment plan if needed.  Applying the monitor   Shave hair from upper left chest.  Hold abrader disc by orange tab. Rub abrader in 40 strokes over the upper left chest as  indicated in your monitor instructions.  Clean area with 4 enclosed alcohol pads. Let dry.  Apply patch as indicated in monitor instructions. Patch will be placed under collarbone on left  side of chest with arrow pointing upward.  Rub patch adhesive wings for 2 minutes. Remove white label marked "1". Remove the white  label marked "2". Rub patch adhesive wings for 2 additional minutes.  While looking in a mirror, press and release button in center of patch. A small green light will  flash 3-4 times. This will  be your only indicator that the monitor has been turned on.  Do not shower for the first 24 hours. You may shower after the first 24 hours.  Press the button if you feel a symptom. You will hear a small click. Record Date, Time and  Symptom in the Patient Logbook.  When you are ready to remove the patch, follow instructions on the last  2 pages of Patient  Logbook. Stick patch monitor onto the last page of Patient Logbook.  Place Patient Logbook in the blue and white box. Use locking tab on box and tape box closed  securely. The blue and white box has prepaid postage on it. Please place it in the mailbox as  soon as possible. Your physician should have your test results approximately 7 days after the  monitor has been mailed back to [facility].  Call [facility] Customer Care at [phone] if you have questions regarding  your ZIO XT patch monitor. Call them immediately if you see an orange light blinking on your  monitor.  If your monitor falls off in less than 4 days, contact our Monitor department at [phone].  If your monitor becomes loose or falls off after 4 days call [employer] at [phone] for  suggestions on securing your monitor   Follow-Up: At [facility], you and your health needs are our priority.  As part of our continuing mission to provide you with exceptional heart care, we have created designated Provider Care Teams.  These Care Teams include your primary Cardiologist (physician) and Advanced Practice Providers (APPs -  Physician Assistants and Nurse Practitioners) who all work together to provide you with the care you need, when you need it.  We recommend signing up for the patient portal called "MyChart".  Sign up information is provided on this After Visit Summary.  MyChart is used to connect with patients for Virtual Visits (Telemedicine).  Patients are able to view lab/test results, encounter notes, upcoming appointments, etc.  Non-urgent messages can be sent to your provider as well.   To learn more about what you can do with MyChart, go to [email or web address].    Your next appointment:   4 week(s)  Provider:   Rollene Rotunda, MD

## 2023-08-19 NOTE — Progress Notes (Unsigned)
Enrolled patient for a 3 day Zio XT monitor to be mailed to patients home  

## 2023-08-22 DIAGNOSIS — R002 Palpitations: Secondary | ICD-10-CM

## 2023-09-21 ENCOUNTER — Ambulatory Visit (HOSPITAL_COMMUNITY)
Admission: RE | Admit: 2023-09-21 | Discharge: 2023-09-21 | Disposition: A | Discharge status: Home / Self Care | Payer: Medicaid Other | Source: Ambulatory Visit | Attending: Cardiology | Admitting: Cardiology

## 2023-09-21 DIAGNOSIS — R011 Cardiac murmur, unspecified: Secondary | ICD-10-CM | POA: Diagnosis not present

## 2023-09-21 LAB — ECHOCARDIOGRAM COMPLETE
AR max vel: 2.3 cm2
AV Area VTI: 2.95 cm2
AV Area mean vel: 2.5 cm2
AV Mean grad: 18 mm[Hg]
AV Peak grad: 42.5 mm[Hg]
Ao pk vel: 3.26 m/s
Area-P 1/2: 2.84 cm2
MV M vel: 3.5 m/s
MV Peak grad: 49 mm[Hg]
S' Lateral: 3.11 cm

## 2023-10-05 ENCOUNTER — Ambulatory Visit: Payer: Medicaid Other | Admitting: Cardiology

## 2023-10-31 NOTE — Progress Notes (Unsigned)
  Cardiology Office Note:   Date:  11/02/2023  ID:  [name], DOB [birth year], MRN [id number] PCP: Patient, No Pcp Per  [facility] Providers Cardiologist:  Rollene Rotunda, MD {  History of Present Illness:   [name] is a 33 y.o. male ho presents for evaluation of a murmur.  He has hypertrophic cardiomyopathy.  He had a Zio that did not demonstrate any arrhythmias.   He feels well.  He works in Sport and exercise psychologist.  He has a 10-year-old with nonverbal autism and a 74-year-old neuro typical child.  He does about 10,000 steps a day in his job.  He denies any chest pressure, neck or arm discomfort.  Has had no new shortness of breath, PND or orthopnea.  He said no palpitations, presyncope or syncope.  Has had no weight gain or edema.  Of note his echo did demonstrate a significant resting gradient increasing to 76.  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  There was some evidence of systolic anterior motion.  There was mention of aortic valve gradient but I suspect subvalvular.   ROS:   As stated in the HPI and negative for all other systems.  Studies Reviewed:    EKG:   NSR, repolarization changes.  Unchanged from previous.   Risk Assessment/Calculations:              Physical Exam:   VS:  BP (!) 102/58 (BP Location: Left Arm, Patient Position: Sitting, Cuff Size: Normal)   Pulse 84   Ht 5\' 8"  (1.727 m)   Wt 180 lb (81.6 kg)   SpO2 97%   BMI 27.37 kg/m    Wt Readings from Last 3 Encounters:  11/02/23 180 lb (81.6 kg)  08/19/23 170 lb (77.1 kg)  07/18/23 160 lb (72.6 kg)     GEN: Well nourished, well developed in no acute distress NECK: No JVD; No carotid bruits CARDIAC: RRR, 3 out of 6 apical systolic murmur increasing with the strain phase of Valsalva, no diastolic murmurs, rubs, gallops RESPIRATORY:  Clear to auscultation without rales, wheezing or rhonchi  ABDOMEN: Soft, non-tender, non-distended EXTREMITIES:  No  edema; No deformity   ASSESSMENT AND PLAN:   Hypertrophic cardiomyopathy: We had a long discussion about this today.  He needs genetic testing.  He we will also get an MRI looking for high risk features.  So far there are no high risk findings and ICD is not indicated.  He has no symptoms and has no change in therapy.  We talked about symptoms to include dizziness or presyncope we talked about shortness of breath.     Follow up with me in 6 months.   Signed, Rollene Rotunda, MD

## 2023-11-02 ENCOUNTER — Encounter: Payer: Self-pay | Admitting: Cardiology

## 2023-11-02 ENCOUNTER — Ambulatory Visit: Payer: Medicaid Other | Attending: Cardiology | Admitting: Cardiology

## 2023-11-02 VITALS — BP 102/58 | HR 84 | Ht 68.0 in | Wt 180.0 lb

## 2023-11-02 DIAGNOSIS — G729 Myopathy, unspecified: Secondary | ICD-10-CM

## 2023-11-02 DIAGNOSIS — R002 Palpitations: Secondary | ICD-10-CM | POA: Diagnosis not present

## 2023-11-02 NOTE — Patient Instructions (Signed)
 Medication Instructions:  No changes.  *If you need a refill on your cardiac medications before your next appointment, please call your pharmacy*   Lab Work: Hemoglobin and Hematocrit no appointment needed non fasting. If you have labs (blood work) drawn today and your tests are completely normal, you will receive your results only by: MyChart Message (if you have MyChart) OR A paper copy in the mail If you have any lab test that is abnormal or we need to change your treatment, we will call you to review the results.   Testing/Procedures: Your physician has requested that you have a cardiac MRI. Cardiac MRI uses a computer to create images of your heart as its beating, producing both still and moving pictures of your heart and major blood vessels. For further information please visit [email or web address]. Please follow the instruction sheet given to you today for more information.    Follow-Up: At [facility], you and your health needs are our priority.  As part of our continuing mission to provide you with exceptional heart care, we have created designated Provider Care Teams.  These Care Teams include your primary Cardiologist (physician) and Advanced Practice Providers (APPs -  Physician Assistants and Nurse Practitioners) who all work together to provide you with the care you need, when you need it.  We recommend signing up for the patient portal called "MyChart".  Sign up information is provided on this After Visit Summary.  MyChart is used to connect with patients for Virtual Visits (Telemedicine).  Patients are able to view lab/test results, encounter notes, upcoming appointments, etc.  Non-urgent messages can be sent to your provider as well.   To learn more about what you can do with MyChart, go to [email or web address].    Your next appointment:   6 month(s)  Provider:   Rollene Rotunda, MD     Other Instructions

## 2023-11-07 ENCOUNTER — Other Ambulatory Visit: Payer: Self-pay

## 2023-11-07 DIAGNOSIS — G729 Myopathy, unspecified: Secondary | ICD-10-CM

## 2023-11-07 DIAGNOSIS — R002 Palpitations: Secondary | ICD-10-CM

## 2023-11-08 ENCOUNTER — Encounter (HOSPITAL_COMMUNITY): Payer: Self-pay

## 2023-11-08 LAB — HEMOGLOBIN AND HEMATOCRIT, BLOOD
Hematocrit: 45.5 % (ref 37.5–51.0)
Hemoglobin: 15.8 g/dL (ref 13.0–17.7)

## 2023-11-14 ENCOUNTER — Encounter: Payer: Self-pay | Admitting: *Deleted

## 2023-12-01 ENCOUNTER — Encounter (HOSPITAL_COMMUNITY): Payer: Self-pay

## 2023-12-05 ENCOUNTER — Ambulatory Visit (HOSPITAL_COMMUNITY)
Admission: RE | Admit: 2023-12-05 | Discharge: 2023-12-05 | Disposition: A | Discharge status: Home / Self Care | Source: Ambulatory Visit | Attending: Cardiology | Admitting: Cardiology

## 2023-12-05 ENCOUNTER — Other Ambulatory Visit: Payer: Self-pay | Admitting: Cardiology

## 2023-12-05 DIAGNOSIS — G729 Myopathy, unspecified: Secondary | ICD-10-CM | POA: Insufficient documentation

## 2023-12-05 DIAGNOSIS — I361 Nonrheumatic tricuspid (valve) insufficiency: Secondary | ICD-10-CM

## 2023-12-05 DIAGNOSIS — I340 Nonrheumatic mitral (valve) insufficiency: Secondary | ICD-10-CM

## 2023-12-05 DIAGNOSIS — R002 Palpitations: Secondary | ICD-10-CM | POA: Insufficient documentation

## 2023-12-05 MED ORDER — GADOBUTROL 1 MMOL/ML IV SOLN
10.0000 mL | Freq: Once | INTRAVENOUS | Status: AC | PRN
  Administered 2023-12-05: 10 mL via INTRAVENOUS

## 2023-12-12 ENCOUNTER — Encounter: Payer: Self-pay | Admitting: *Deleted

## 2024-03-23 ENCOUNTER — Ambulatory Visit

## 2024-04-03 ENCOUNTER — Encounter: Payer: Self-pay | Admitting: Cardiology

## 2024-05-03 ENCOUNTER — Encounter: Payer: Self-pay | Admitting: Cardiology

## 2024-05-11 ENCOUNTER — Other Ambulatory Visit: Payer: Self-pay | Admitting: Medical Genetics

## 2024-05-18 ENCOUNTER — Other Ambulatory Visit (HOSPITAL_COMMUNITY)
Admission: RE | Admit: 2024-05-18 | Discharge: 2024-05-18 | Disposition: A | Discharge status: Home / Self Care | Payer: Self-pay | Source: Ambulatory Visit | Attending: Medical Genetics | Admitting: Medical Genetics

## 2024-05-18 ENCOUNTER — Ambulatory Visit

## 2024-05-18 VITALS — BP 117/70 | HR 62 | Ht 68.0 in | Wt 178.0 lb

## 2024-05-18 DIAGNOSIS — L989 Disorder of the skin and subcutaneous tissue, unspecified: Secondary | ICD-10-CM | POA: Diagnosis not present

## 2024-05-18 NOTE — Assessment & Plan Note (Signed)
 Chronic lesion on face, possibly ingrown hair, unresolved with self-treatment. - Refer to dermatologist for evaluation.

## 2024-05-18 NOTE — Assessment & Plan Note (Signed)
 Chronic recurrent right palmar lesion  - Refer to dermatologist for evaluation.

## 2024-05-18 NOTE — Progress Notes (Signed)
 New Patient Office Visit  Subjective    Patient ID: ARKEEM HARTS, male    DOB: [birth year]  Age: 33 y.o. MRN: [id number]  CC:  Chief Complaint  Patient presents with   Establish Care   Referral    Referral to dermatology     HPI [name] presents to establish care.  His main concern is nonhealing lesions on his face which he reports has been there for years.  He also has some recent lesions on his right hand which he treated with topical wart pads.  He is requesting a referral to dermatology.  He sees cardiology, Dr.Hochrein, for hypertrophic cardiomyopathy and palpitations. He denies any other concerns at this time.  Outpatient Encounter Medications as of 05/18/2024  Medication Sig   [DISCONTINUED] ibuprofen  (ADVIL ,MOTRIN ) 600 MG tablet Take 1 tablet (600 mg total) by mouth every 8 (eight) hours as needed. (Patient not taking: Reported on 05/18/2024)   No facility-administered encounter medications on file as of 05/18/2024.    History reviewed. No pertinent past medical history.  Past Surgical History:  Procedure Laterality Date   TONSILLECTOMY      Family History  Problem Relation Age of Onset   Hypertension Mother     Social History   Socioeconomic History   Marital status: Married    Spouse name: Not on file   Number of children: Not on file   Years of education: Not on file   Highest education level: 12th grade  Occupational History   Not on file  Tobacco Use   Smoking status: Never   Smokeless tobacco: Never  Substance and Sexual Activity   Alcohol use: No   Drug use: Not Currently   Sexual activity: Not on file  Other Topics Concern   Not on file  Social History Narrative   Married.   Two children   Social Drivers of Corporate investment banker Strain: Low Risk  (05/18/2024)   Overall Financial Resource Strain (CARDIA)    Difficulty of Paying Living Expenses: Not very hard  Food Insecurity: No Food Insecurity (05/18/2024)   Hunger  Vital Sign    Worried About Running Out of Food in the Last Year: Never true    Ran Out of Food in the Last Year: Never true  Transportation Needs: No Transportation Needs (05/18/2024)   PRAPARE - Administrator, Civil Service (Medical): No    Lack of Transportation (Non-Medical): No  Physical Activity: Sufficiently Active (05/18/2024)   Exercise Vital Sign    Days of Exercise per Week: 5 days    Minutes of Exercise per Session: 40 min  Stress: No Stress Concern Present (05/18/2024)   [employer] of Occupational Health - Occupational Stress Questionnaire    Feeling of Stress: Not at all  Social Connections: Moderately Isolated (05/18/2024)   Social Connection and Isolation Panel    Frequency of Communication with Friends and Family: More than three times a week    Frequency of Social Gatherings with Friends and Family: Never    Attends Religious Services: Never    Database administrator or Organizations: No    Attends Engineer, structural: Not on file    Marital Status: Married  Catering manager Violence: Not on file    ROS      Objective    BP 117/70   Pulse 62   Ht 5' 8 (1.727 m)   Wt 178 lb (80.7 kg)   SpO2 95%  BMI 27.06 kg/m   Physical Exam Vitals and nursing note reviewed.  Constitutional:      Appearance: Normal appearance.  HENT:     Head: Normocephalic.  Eyes:     Extraocular Movements: Extraocular movements intact.     Pupils: Pupils are equal, round, and reactive to light.  Cardiovascular:     Rate and Rhythm: Normal rate and regular rhythm.     Pulses: Normal pulses.     Heart sounds: Murmur heard.     Systolic murmur is present with a grade of 3/6.  Pulmonary:     Effort: Pulmonary effort is normal.     Breath sounds: Normal breath sounds.  Musculoskeletal:     Cervical back: Normal range of motion and neck supple.     Right lower leg: No edema.     Left lower leg: No edema.  Neurological:     Mental Status: He is  alert and oriented to person, place, and time.  Psychiatric:        Mood and Affect: Mood normal.        Thought Content: Thought content normal.            Assessment & Plan:   Problem List Items Addressed This Visit       Musculoskeletal and Integument   Skin lesion of face - Primary   Chronic lesion on face, possibly ingrown hair, unresolved with self-treatment. - Refer to dermatologist for evaluation.      Relevant Orders   Ambulatory referral to Dermatology   Skin lesion of hand   Chronic recurrent right palmar lesion  - Refer to dermatologist for evaluation.      Relevant Orders   Ambulatory referral to Dermatology       Return in about 1 year (around 05/18/2025) for for yearly physical.   Leita Longs, FNP

## 2024-05-27 LAB — GENECONNECT MOLECULAR SCREEN: Genetic Analysis Overall Interpretation: NEGATIVE

## 2024-05-28 NOTE — Progress Notes (Unsigned)
  Cardiology Office Note:   Date:  05/28/2024  ID:  [name], DOB 07-16-1991, MRN [id number] PCP: Bevely Doffing, FNP  [facility] Providers Cardiologist:  Lynwood Schilling, MD {  History of Present Illness:   [name] is a 33 y.o. male ho presents for evaluation of a murmur.  He has hypertrophic cardiomyopathy.  Echo did demonstrate a significant resting gradient increasing to 76.  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 He had a Zio that did not demonstrate any arrhythmias.   LGE was about 8%.  He had mild to moderate MR.  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 He has an active job.    ***  *** He does about 10,000 steps a day in his job.  He denies any chest pressure, neck or arm discomfort.  Has had no new shortness of breath, PND or orthopnea.  He said no palpitations, presyncope or syncope.  Has had no weight gain or edema.   Of note his  There was some evidence of systolic anterior motion.  There was mention of aortic valve gradient but I suspect subvalvular.    ROS: ***  Studies Reviewed:    EKG:       ***  Risk Assessment/Calculations:   {Does this patient have ATRIAL FIBRILLATION?:[phone]} No BP recorded.  {Refresh Note OR Click here to enter BP  :1}***        Physical Exam:   VS:  There were no vitals taken for this visit.   Wt Readings from Last 3 Encounters:  05/18/24 178 lb (80.7 kg)  11/02/23 180 lb (81.6 kg)  08/19/23 170 lb (77.1 kg)     GEN: Well nourished, well developed in no acute distress NECK: No JVD; No carotid bruits CARDIAC: ***RR, *** murmurs, rubs, gallops RESPIRATORY:  Clear to auscultation without rales, wheezing or rhonchi  ABDOMEN: Soft, non-tender, non-distended EXTREMITIES:  No edema; No deformity   ASSESSMENT AND PLAN:   Hypertrophic cardiomyopathy: ***   We had a long discussion about this today.  He  needs genetic testing.  He we will also get an MRI looking for high risk features.  So far there are no high risk findings and ICD is not indicated.  He has no symptoms and has no change in therapy.  We talked about symptoms to include dizziness or presyncope we talked about shortness of breath.        Follow up ***  Signed, Lynwood Schilling, MD

## 2024-05-30 ENCOUNTER — Ambulatory Visit: Attending: Internal Medicine | Admitting: Cardiology

## 2024-05-30 ENCOUNTER — Encounter: Payer: Self-pay | Admitting: Cardiology

## 2024-05-30 VITALS — BP 98/60 | HR 49 | Ht 67.0 in | Wt 176.0 lb

## 2024-05-30 DIAGNOSIS — G729 Myopathy, unspecified: Secondary | ICD-10-CM | POA: Insufficient documentation

## 2024-05-30 DIAGNOSIS — R002 Palpitations: Secondary | ICD-10-CM | POA: Diagnosis not present

## 2024-05-30 NOTE — Patient Instructions (Signed)
 Medication Instructions:  Your physician recommends that you continue on your current medications as directed. Please refer to the Current Medication list given to you today.  *If you need a refill on your cardiac medications before your next appointment, please call your pharmacy*  Lab Work: NONE If you have labs (blood work) drawn today and your tests are completely normal, you will receive your results only by: MyChart Message (if you have MyChart) OR A paper copy in the mail If you have any lab test that is abnormal or we need to change your treatment, we will call you to review the results.  Testing/Procedures: NONE  Follow-Up: At [facility], you and your health needs are our priority.  As part of our continuing mission to provide you with exceptional heart care, our providers are all part of one team.  This team includes your primary Cardiologist (physician) and Advanced Practice Providers or APPs (Physician Assistants and Nurse Practitioners) who all work together to provide you with the care you need, when you need it.  Your next appointment:   With Santo, MD  **Someone will reach out to you to make an appointment  We recommend signing up for the patient portal called MyChart.  Sign up information is provided on this After Visit Summary.  MyChart is used to connect with patients for Virtual Visits (Telemedicine).  Patients are able to view lab/test results, encounter notes, upcoming appointments, etc.  Non-urgent messages can be sent to your provider as well.   To learn more about what you can do with MyChart, go to [email or web address].

## 2024-06-13 ENCOUNTER — Ambulatory Visit

## 2024-06-13 DIAGNOSIS — L731 Pseudofolliculitis barbae: Secondary | ICD-10-CM | POA: Diagnosis not present

## 2024-06-13 DIAGNOSIS — B079 Viral wart, unspecified: Secondary | ICD-10-CM | POA: Diagnosis not present

## 2024-06-13 MED ORDER — CLINDAMYCIN PHOS (TWICE-DAILY) 1 % EX GEL
Freq: Two times a day (BID) | CUTANEOUS | 5 refills | Status: AC
Start: 2024-06-13 — End: ?

## 2024-06-13 NOTE — Progress Notes (Signed)
 Subjective   [name] is a 33 y.o. male who presents for the following: Lesion(s) of concern . Patient is new patient  New patient referral from Leita Longs, FNP.  Today patient reports: Ingrown hairs beard area, ~12 yrs, no treatment Warts R hand, ~36m, no treatment, in past used otc wart remover for other warts  Review of Systems:    No other skin or systemic complaints except as noted in HPI or Assessment and Plan.  The following portions of the chart were reviewed this encounter and updated as appropriate: medications, allergies, medical history  Relevant Medical History:  n/a   Objective  Well appearing patient in no apparent distress; mood and affect are within normal limits. Examination was performed of the: Focused Exam of: face, right hand   Examination notable for: Warts: Well-demarcated verrucous, hyperkeratotic papule(s) located on the right hand  - Follicularly centered papules and small pustules scattered in beard area   Examination limited by: N/A   R palm x 1, R middle finger x 1 (2) Verrucous papules -- Discussed viral etiology and contagion.   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 - Recommended these behavior modifications: moisturising shaving foam to cleanse the area, use a single blade razor, alternatively, use electric hair clippers, shave in the direction of the follicle, not against it. Do not stretch the skin. - Start Clindagel bid to aa face - Start BP wash qd  Benzoyl peroxide can cause dryness and irritation of the skin. It can also bleach fabric. When used together with Aczone (dapsone) cream, it can stain the skin orange.  Verruca vulgaris - Discussed diagnosis, typical course, and treatment options for this condition - Patient  opted for cryotherapy treatment at today's visit - Informed patient that multiple treatments will be necessary, study shows an average of roughly 5 treatments - Discussed usage of OTC mediplast or salicylic acid, topically, paring down, requiring several weeks to heal.  Level of service outlined above   Procedures, orders, diagnosis for this visit:  VIRAL WARTS, UNSPECIFIED TYPE (2) R palm x 1, R middle finger x 1 (2) Viral Wart (HPV) Counseling  Discussed viral / HPV (Human Papilloma Virus) etiology and risk of spread /infectivity to other areas of body as well as to other people.  Multiple treatments and methods may be required to clear warts and it is possible treatment may not be successful.  Treatment risks include discoloration; scarring and there is still potential for wart recurrence. Destruction of lesion - R palm x 1, R middle finger x 1 (2) Complexity: simple   Destruction method: cryotherapy   Informed consent: discussed and consent obtained   Timeout:  patient name, date of birth, surgical site, and procedure verified Lesion destroyed using liquid nitrogen: Yes   Region frozen until ice ball extended beyond lesion: Yes   Cryo cycles: 1 or 2. Outcome: patient tolerated procedure well with no complications   Post-procedure details: wound care instructions given     Viral warts, unspecified type -     Destruction of lesion  Other orders -     Clindamycin Phos (Twice-Daily); Apply topically 2 (two) times daily. Apply to the affected area of face bid  Dispense: 60 g; Refill: 5    Return to clinic: Return  in about 6 weeks (around 07/25/2024) for wart f/u.    Documentation: I have reviewed the above documentation for accuracy and completeness, and I agree with the above.  Lauraine JAYSON Kanaris, MD

## 2024-06-13 NOTE — Patient Instructions (Addendum)
 AT HOME WART TREATMENT:    To remove your wart with a 40% salicylic acid plaster (e.g. Mediplast, Duoplant) follow these instructions:  1. Soak the area for 10-15 minutes in lukewarm water.  2. Remove the white or rough dead skin using a small piece of fine or medium grade sandpaper. Use a new piece each time. Be careful not to cause bleeding.  If it does bleed, apply pressure to the site for 15 minutes; apply Vaseline and a Band-Aid. Resume treatment to the wart the following day.  3. Once you have soaked the area and removed the overlying dead skin, then cut the piece of plaster so that it covers the wart and only a small amount of normal skin surrounding the wart.  4.Apply the sticky side of the plaster to the wart and cover with waterproof adhesive or duct tape until the patch is secured.  5.Leave the patch in place for 24 hours (or 8 hours overnight if tender) and repeat the process starting with step #1.  You may find with prolonged use that the area becomes tender.    benzoyl peroxide wash 3-5% (brand name includes Neutragena Clear Pore, CereVe Acne Foaming Cream Cleanser, Differin Cleanser) that you use in the shower. Can bleach linens (clothes, towels, etc), will NOT bleach your skin or hair). Dry off with a white towel so it doesn't take the color out of clothing and colored towels.      Due to recent changes in healthcare laws, you may see results of your pathology and/or laboratory studies on MyChart before the doctors have had a chance to review them. We understand that in some cases there may be results that are confusing or concerning to you. Please understand that not all results are received at the same time and often the doctors may need to interpret multiple results in order to provide you with the best plan of care or course of treatment. Therefore, we ask that you please give us  2 business days to thoroughly review all your results before contacting the office for clarification. Should we  see a critical lab result, you will be contacted sooner.   If You Need Anything After Your Visit  If you have any questions or concerns for your doctor, please call our main line at [phone] and press option 4 to reach your doctor's medical assistant. If no one answers, please leave a voicemail as directed and we will return your call as soon as possible. Messages left after 4 pm will be answered the following business day.   You may also send us  a message via MyChart. We typically respond to MyChart messages within 1-2 business days.  For prescription refills, please ask your pharmacy to contact our office. Our fax number is [phone].  If you have an urgent issue when the clinic is closed that cannot wait until the next business day, you can page your doctor at the number below.    Please note that while we do our best to be available for urgent issues outside of office hours, we are not available 24/7.   If you have an urgent issue and are unable to reach us , you may choose to seek medical care at your doctor's office, retail clinic, urgent care center, or emergency room.  If you have a medical emergency, please immediately call 911 or go to the emergency department.  Pager Numbers  - Dr. Hester: [phone]  - Dr. Jackquline: [phone]  - Dr. Claudene: [phone]   -  Dr. Raymund: [phone]  In the event of inclement weather, please call our main line at [phone] for an update on the status of any delays or closures.  Dermatology Medication Tips: Please keep the boxes that topical medications come in in order to help keep track of the instructions about where and how to use these. Pharmacies typically print the medication instructions only on the boxes and not directly on the medication tubes.   If your medication is too expensive, please contact our office at [phone] option 4 or send us  a message through MyChart.   We are unable to tell what your co-pay for  medications will be in advance as this is different depending on your insurance coverage. However, we may be able to find a substitute medication at lower cost or fill out paperwork to get insurance to cover a needed medication.   If a prior authorization is required to get your medication covered by your insurance company, please allow us  1-2 business days to complete this process.  Drug prices often vary depending on where the prescription is filled and some pharmacies may offer cheaper prices.  The website www.goodrx.com contains coupons for medications through different pharmacies. The prices here do not account for what the cost may be with help from insurance (it may be cheaper with your insurance), but the website can give you the price if you did not use any insurance.  - You can print the associated coupon and take it with your prescription to the pharmacy.  - You may also stop by our office during regular business hours and pick up a GoodRx coupon card.  - If you need your prescription sent electronically to a different pharmacy, notify our office through [facility] or by phone at [phone] option 4.     Si Usted Necesita Algo Despus de Su Visita  Tambin puede enviarnos un mensaje a travs de Clinical cytogeneticist. Por lo general respondemos a los mensajes de MyChart en el transcurso de 1 a 2 das hbiles.  Para renovar recetas, por favor pida a su farmacia que se ponga en contacto con nuestra oficina. Randi lakes de fax es [place] [phone].  Si tiene un asunto urgente cuando la clnica est cerrada y que no puede esperar hasta el siguiente da hbil, puede llamar/localizar a su doctor(a) al nmero que aparece a continuacin.   Por favor, tenga en cuenta que aunque hacemos todo lo posible para estar disponibles para asuntos urgentes fuera del horario de [place], no estamos disponibles las 24 horas del da, los 7 [address]  [address] de la [place].   Si tiene un problema urgente y no puede  comunicarse con nosotros, puede optar por buscar atencin mdica  en el consultorio de su doctor(a), en una clnica privada, en un centro de atencin urgente o en una sala de emergencias.  Si tiene Engineer, drilling, por favor llame inmediatamente al 911 o vaya a la sala de emergencias.  Nmeros de bper  - Dr. Hester: [phone]  - Dra. Jackquline: [phone]  - Dr. Claudene: [phone]  - Dra. Kitts: [phone]  En caso de inclemencias del [place], por favor llame a nuestra lnea principal al [phone] para una actualizacin sobre el estado de cualquier retraso o cierre.  Consejos para la medicacin en dermatologa: Por favor, guarde las cajas en las que vienen los medicamentos de uso tpico para ayudarle a seguir las instrucciones sobre dnde y cmo usarlos. Las farmacias generalmente imprimen las instrucciones del medicamento slo en las cajas y  no directamente en los tubos del medicamento.   Si su medicamento es muy caro, por favor, pngase en contacto con landry rieger llamando al [phone] y presione la opcin 4 o envenos un mensaje a travs de Clinical cytogeneticist.   No podemos decirle cul ser su copago por los medicamentos por adelantado ya que esto es diferente dependiendo de la cobertura de su seguro. Sin embargo, es posible que podamos encontrar un medicamento sustituto a Audiological scientist un formulario para que el seguro cubra el medicamento que se considera necesario.   Si se requiere una autorizacin previa para que su compaa de seguros malta su medicamento, por favor permtanos de 1 a 2 das hbiles para completar este proceso.  Los precios de los medicamentos varan con frecuencia dependiendo del Environmental consultant de dnde se surte la receta y alguna farmacias pueden ofrecer precios ms baratos.  El sitio web www.goodrx.com tiene cupones para medicamentos de Health and safety inspector. Los precios aqu no tienen en cuenta lo que podra costar con la ayuda del seguro (puede ser ms  barato con su seguro), pero el sitio web puede darle el precio si no utiliz Tourist information centre manager.  - Puede imprimir el cupn correspondiente y llevarlo con su receta a la farmacia.  - Tambin puede pasar por nuestra oficina durante el horario de atencin regular y Education officer, museum una tarjeta de cupones de GoodRx.  - Si necesita que su receta se enve electrnicamente a una farmacia diferente, informe a nuestra oficina a travs de MyChart de [place] o por telfono llamando al [phone] y presione la opcin 4.

## 2024-07-25 ENCOUNTER — Ambulatory Visit

## 2024-08-03 ENCOUNTER — Ambulatory Visit: Payer: Self-pay | Admitting: Internal Medicine

## 2024-08-03 ENCOUNTER — Ambulatory Visit: Payer: Self-pay | Attending: Internal Medicine | Admitting: Internal Medicine

## 2024-08-03 VITALS — BP 137/73 | HR 84 | Ht 68.0 in | Wt 178.0 lb

## 2024-08-03 DIAGNOSIS — I422 Other hypertrophic cardiomyopathy: Secondary | ICD-10-CM | POA: Diagnosis not present

## 2024-08-03 NOTE — Patient Instructions (Addendum)
 Medication Instructions:  Your physician recommends that you continue on your current medications as directed. Please refer to the Current Medication list given to you today.  *If you need a refill on your cardiac medications before your next appointment, please call your pharmacy*  Lab Work: None ordered.  If you have labs (blood work) drawn today and your tests are completely normal, you will receive your results only by: MyChart Message (if you have MyChart) OR A paper copy in the mail If you have any lab test that is abnormal or we need to change your treatment, we will call you to review the results.  Testing/Procedures: March 2026 Your physician has requested that you have an exercise tolerance test. For further information please visit [email or web address] Please also follow instruction sheet, as given.   Follow-Up: At [facility], you and your health needs are our priority.  As part of our continuing mission to provide you with exceptional heart care, our providers are all part of one team.  This team includes your primary Cardiologist (physician) and Advanced Practice Providers or APPs (Physician Assistants and Nurse Practitioners) who all work together to provide you with the care you need, when you need it.  Your next appointment:   Dr Santo June 2026

## 2024-08-03 NOTE — Progress Notes (Signed)
 Cardiology Office Note:  .    Date:  08/03/2024  ID:  [name], DOB 14-Oct-1990, MRN [id number] PCP: Bevely Doffing, FNP   HeartCare Providers Cardiologist:  Lynwood Schilling, MD     CC: [facility] Consulted for the evaluation of oHCM at the behest of Dr. Schilling   History of Present Illness: Sergio    [name] is a 33 y.o. male with hypertrophic cardiomyopathy who presents for evaluation of symptoms and management options. He is accompanied by his wife.  He has a history of hypertrophic cardiomyopathy, diagnosed during his wife's first pregnancy when he began experiencing symptoms similar to those described by his father, who also had the condition. There is a strong family history of hypertrophic cardiomyopathy, with his father, grandmother, and great-grandfather having had the condition. His father died from it, and his grandmother and great-grandfather also had related complications.  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 Symptoms worsen on hot or cold days and when he is dehydrated. He also experiences chest discomfort after large meals or when lying down, describing it as a 'cramp pain' when his heart rate increases.  He has undergone annual echocardiograms. He has not experienced any episodes of syncope. He monitors his heart rate and activity levels using a smartwatch.  He has tried medications in the past, such as beta blockers, but experienced adverse effects like blurred vision. He currently manages his condition by staying hydrated and avoiding triggers like large meals and dehydration.  Discussed the use of AI scribe software for clinical note transcription with the patient, who gave verbal consent to proceed.   Relevant histories: .  Social   Illicit Substances: Marijuana (Patient has been trying to quit.) Employment: Engineer, civil (consulting) Patient works out twice a week and walks about ten thousand steps a day. He has a family history of hypertrophic cardiomyopathy. He has two children and is expecting a third. He is concerned about his health while working, especially regarding the risk of passing out. ROS: As per HPI.   Studies Reviewed: .     Cardiac Studies & Procedures   ______________________________________________________________________________________________     ECHOCARDIOGRAM  ECHOCARDIOGRAM COMPLETE 09/21/2023  Narrative ECHOCARDIOGRAM REPORT    Patient Name:   [name] Date of Exam: 09/21/2023 Medical Rec #:  [id number]          Height:       68.0 in Accession #:    [id number]         Weight:       170.0 lb Date of Birth:  1991-01-10         BSA:          1.907 m Patient Age:    32 years           BP:           108/61 mmHg Patient Gender: M                  HR:           68 bpm. Exam Location:  Outpatient  Procedure: 2D Echo, 3D Echo, Cardiac Doppler, Color Doppler and Strain Analysis  Indications:    Murmur, heart [R01.1 (ICD-10-CM)], Dyspnea on exertion  History:  Patient has no prior history of Echocardiogram examinations. Risk Factors:None.  Sonographer:    Rosaline Fujisawa MHA, RDMS, RVT, RDCS Referring Phys: [id number]   Sonographer Comments: Global longitudinal strain was attempted. IMPRESSIONS   1. Dynamic LVOT obstruction - rest gradient 60 mmHg -> 76 mmHg (with valsalva). In the 3 chamber view the LVOT gradient was as high as 105 mmHg. There is also papillary muscle thickening. Findings could suggest global variant HOCM. Left ventricular ejection fraction, by estimation, is 60 to 65%. Left ventricular ejection fraction by 3D volume is 62 %. The left ventricle has normal function. The left ventricle has no regional wall motion abnormalities. There is  moderate concentric left ventricular hypertrophy. Left ventricular diastolic parameters are consistent with Grade I diastolic dysfunction (impaired relaxation). 2. Right ventricular systolic function is normal. The right ventricular size is normal. There is normal pulmonary artery systolic pressure. The estimated right ventricular systolic pressure is 9.6 mmHg. 3. Left atrial size was moderately dilated. 4. The mitral valve is abnormal. There is severe thickening/mass of the anterior mitral leaflet, which appears to cause SAM and intermittent LVOT obstrution. Trivial mitral valve regurgitation. 5. The aortic valve is tricuspid. Aortic valve regurgitation is not visualized. 6. The inferior vena cava is normal in size with greater than 50% respiratory variability, suggesting right atrial pressure of 3 mmHg.  Comparison(s): No prior Echocardiogram.  Conclusion(s)/Recommendation(s): Etiology of the anterior mitral leaflet thickening/mass is unclear. There appears to be moderate concentric LVH. This may be a HCM variant. Consider papillary fibroelastoma or less likely vegetation as a cause of the anterior mitral valve leaflet abnormality. TEE may be helpful to better visualize.  FINDINGS Left Ventricle: Dynamic LVOT obstruction - rest gradient 60 mmHg -> 76 mmHg (with valsalva). In the 3 chamber view the LVOT gradient was as high as 105 mmHg. There is also papillary muscle thickening. Findings could suggest global variant HOCM. Left ventricular ejection fraction, by estimation, is 60 to 65%. Left ventricular ejection fraction by 3D volume is 62 %. The left ventricle has normal function. The left ventricle has no regional wall motion abnormalities. The left ventricular internal cavity size was normal in size. There is moderate concentric left ventricular hypertrophy. Left ventricular diastolic parameters are consistent with Grade I diastolic dysfunction (impaired relaxation). Indeterminate filling  pressures.  Right Ventricle: The right ventricular size is normal. No increase in right ventricular wall thickness. Right ventricular systolic function is normal. There is normal pulmonary artery systolic pressure. The tricuspid regurgitant velocity is 1.28 m/s, and with an assumed right atrial pressure of 3 mmHg, the estimated right ventricular systolic pressure is 9.6 mmHg.  Left Atrium: Left atrial size was moderately dilated.  Right Atrium: Right atrial size was normal in size.  Pericardium: There is no evidence of pericardial effusion.  Mitral Valve: The mitral valve is abnormal. There is severe thickening of the anterior mitral valve leaflet(s). Trivial mitral valve regurgitation.  Tricuspid Valve: The tricuspid valve is grossly normal. Tricuspid valve regurgitation is trivial.  Aortic Valve: The aortic valve is tricuspid. Aortic valve regurgitation is not visualized. Aortic valve mean gradient measures 18.0 mmHg. Aortic valve peak gradient measures 42.5 mmHg. Aortic valve area, by VTI measures 2.95 cm.  Pulmonic Valve: The pulmonic valve was normal in structure. Pulmonic valve regurgitation is not visualized.  Aorta: The aortic root and ascending aorta are structurally normal, with no evidence of dilitation.  Venous: The inferior vena cava is normal in size with greater than 50% respiratory variability,  suggesting right atrial pressure of 3 mmHg.  IAS/Shunts: No atrial level shunt detected by color flow Doppler.   LEFT VENTRICLE PLAX 2D LVIDd:         4.55 cm         Diastology LVIDs:         3.11 cm         LV e' medial:    5.66 cm/s LV PW:         1.46 cm         LV E/e' medial:  17.7 LV IVS:        1.16 cm         LV e' lateral:   7.94 cm/s LVOT diam:     1.96 cm         LV E/e' lateral: 12.6 LV SV:         151 LV SV Index:   79 LVOT Area:     3.02 cm        3D Volume EF LV 3D EF:    Left ventricul ar ejection fraction by 3D volume is 62 %.  3D Volume EF: 3D  EF:        62 % LV EDV:       131 ml LV ESV:       50 ml LV SV:        81 ml  RIGHT VENTRICLE             IVC RV Basal diam:  3.33 cm     IVC diam: 2.01 cm RV Mid diam:    2.94 cm RV S prime:     18.10 cm/s TAPSE (M-mode): 3.0 cm  LEFT ATRIUM             Index        RIGHT ATRIUM           Index LA diam:        4.53 cm 2.38 cm/m   RA Area:     18.00 cm LA Vol (A2C):   63.5 ml 33.29 ml/m  RA Volume:   44.60 ml  23.38 ml/m LA Vol (A4C):   87.8 ml 46.03 ml/m LA Biplane Vol: 75.3 ml 39.48 ml/m AORTIC VALVE AV Area (Vmax):    2.30 cm AV Area (Vmean):   2.50 cm AV Area (VTI):     2.95 cm AV Vmax:           326.00 cm/s AV Vmean:          186.000 cm/s AV VTI:            0.510 m AV Peak Grad:      42.5 mmHg AV Mean Grad:      18.0 mmHg LVOT Vmax:         248.00 cm/s LVOT Vmean:        154.000 cm/s LVOT VTI:          0.499 m LVOT/AV VTI ratio: 0.98  AORTA Ao Root diam: 2.88 cm Ao Asc diam:  2.50 cm  MITRAL VALVE                TRICUSPID VALVE MV Area (PHT): 2.84 cm     TR Peak grad:   6.6 mmHg MV Decel Time: 267 msec     TR Vmax:        128.00 cm/s MR Peak grad: 49.0 mmHg MR Vmax:      350.00 cm/s   SHUNTS  MV E velocity: 100.00 cm/s  Systemic VTI:  0.50 m MV A velocity: 92.80 cm/s   Systemic Diam: 1.96 cm MV E/A ratio:  1.08  Vinie Maxcy MD Electronically signed by Vinie Maxcy MD Signature Date/Time: 09/21/2023/5:23:44 PM    Final    MONITORS  LONG TERM MONITOR (3-14 DAYS) 09/06/2023  Narrative Predominant rhythm was normal sinus No sustained arrhythmias     CARDIAC MRI  MR CARDIAC MORPHOLOGY W WO CONTRAST 12/05/2023  Narrative CLINICAL DATA:  HCM  COMPARISON: Echo 09/21/23  EXAM: MR CARDIA MORPHOLOGY WITHOUT AND WITH CONTRAST; MR CARDIAC VELOCITY FLOW MAPPING  TECHNIQUE: The patient was scanned on a 1.5 Tesla Siemens magnet. A dedicated cardiac coil was used. Functional imaging was done using TrueFisp sequences. 2,3, and 4 chamber views  were done to assess for RWMA's. Modified Simpson's rule using a short axis stack was used to calculate an ejection fraction on a dedicated work [employer]. The patient received 10mL GADAVIST  GADOBUTROL  1 MMOL/ML IV SOLN. After 10 minutes inversion recovery sequences were used to assess for infiltration and scar tissue. Phase contrast velocity encoded images obtained x 2.  This examination is tailored for evaluation cardiac anatomy and function and provides very limited assessment of noncardiac structures, which are accordingly not evaluated during interpretation. If there is clinical concern for extracardiac pathology, further evaluation with CT imaging should be considered.  FINDINGS: LEFT VENTRICLE:  Normal left ventricular chamber size.  Maximal wall thickness: 25 mm  Location: Mid inferoseptum  There is systolic anterior motion of the mitral valve. Flow dephasing suggests left ventricular outflow tract obstruction. Mitral regurgitation is seen.  No evidence of left ventricular apical aneurysm.  Findings are consistent with hypertrophic cardiomyopathy with obstruction. Morphologic subtype: Reverse curve  Normal left ventricular systolic function.  LVEF = 69%  There are no regional wall motion abnormalities.  No myocardial edema, T2 = 51 msec  Normal first pass perfusion.  There is post contrast delayed myocardial enhancement: Patchy diffuse LGE primarily in the area of maximal wall thickness, mid inferoseptum. Challenging to quantitate due to faint LGE, however appears to be approximately 8% of myocardial mass (<15% of myocardial mass).  Normal T1 myocardial nulling kinetics suggest against a diagnosis of cardiac amyloidosis.  ECV = 27%, grossly normal range  RIGHT VENTRICLE:  Normal right ventricular chamber size.  Normal right ventricular wall thickness.  Normal right ventricular systolic function.  RVEF = 63%  There are no  regional wall motion abnormalities.  No post contrast delayed myocardial enhancement.  ATRIA:  Left atrium: Mild dilation  Right atrium: normal  PERICARDIUM:  Normal pericardium.  Trivial pericardial effusion.  OTHER: No significant extracardiac findings.  MEASUREMENTS: Qp/Qs: 1  Aortic valve regurgitation: Trivial, regurgitant fraction 1%  Pulmonary valve regurgitation: Trivial, regurgitant fraction 1%  Mitral valve regurgitation: Mild-moderate, regurgitant fraction 22%  Tricuspid valve regurgitation: Mild, regurgitant fraction 12%  Left ventricle:  LV male  LV EF: 69% (normal 49-79%)  Absolute volumes:  LV EDV: 164 mL (Normal 95-215 mL)  LV ESV: 51mL (Normal 25-85 mL)  LV SV: 113  ML (Normal 61-145 mL)  CO: 5.6  L/min (Normal 3.4-7.8 L/min)  Indexed volumes:  LV EDV: 56mL/sq-m (normal 50-108 mL/sq-m)  LV ESV: 60mL/sq-m (Normal 11-47 mL/sq-m)  LV SV: 51mL/sq-m (Normal 33-72 mL/sq-m)  CI: 2.85L/min/sq-m (Normal 1.8-4.2 L/min/sq-m)  Right ventricle:  RV male  RV EF:  63% (Normal 51-80%)  Absolute volumes:  RV EDV: (Normal 109-217 mL)  RV ESV:  59mL (Normal 23-91 mL)  RV SV: (Normal 71-141 mL)  CO: 5.0L/min (Normal 2.8-8.8 L/min)  Indexed volumes:  RV EDV: 11mL/sq-m (Normal 58-109 mL/sq-m)  RV ESV: 77mL/sq-m (Normal 12-46 mL/sq-m)  RV SV: 26mL/sq-m (Normal 38-71 mL/sq-m)  CI: 2.54L/min/sq-m (Normal 1.7-4.2 L/min/sq-m)  IMPRESSION: 1. Hypertrophic cardiomyopathy with LVOT obstruction, reverse curve subtype. 25 mm maximal wall thickness in the mid inferoseptum. Patchy diffuse LGE primarily in the area of maximal wall thickness, mid inferoseptum. Challenging to quantitate due to faint LGE, however appears to be approximately 8% of myocardial mass (<15% of myocardial mass).  2. Systolic anterior motion of the mitral valve with mild-moderate mitral valve regurgitation by regurgitant fraction calculation, RF 22%. The anterior  mitral leaflet is mildly thickened but no mass is appreciated, though small valve lesions are below the spatial resolution of MRI and are not fully evaluated. Finding on echo may represent contact lesion, subvalvular thickening or anomalous chordal insertion with SAM. If clinical concern persists, consider alternate imaging.  3. Normal left ventricular chamber size and systolic function, LVEF 69%  4. Normal right ventricular chamber size and systolic function, RVEF 63%.   Electronically Signed By: Soyla Merck M.D. On: 12/08/2023 15:24   ______________________________________________________________________________________________        Physical Exam:    VS:  BP 137/73   Pulse 84   Ht 5' 8 (1.727 m)   Wt 178 lb (80.7 kg)   SpO2 96%   BMI 27.06 kg/m    Wt Readings from Last 3 Encounters:  08/03/24 178 lb (80.7 kg)  05/30/24 176 lb (79.8 kg)  05/18/24 178 lb (80.7 kg)    Gen: no distress Cardiac: No Rubs or Gallops, Harsh systolic standing Murmur, RRR +2 radial pulses Respiratory: Clear to auscultation bilaterally, normal effort, normal  respiratory rate GI: Soft, nontender, non-distended  MS: No  edema;  moves all extremities Integument: Skin feels warm Neuro:  At time of evaluation, alert and oriented to person/place/time/situation  Psych: Normal affect, patient feels ok     ASSESSMENT AND PLAN: .    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  I offered testing largely for reproductive planning but he has deferred - NYHA I  - Non HCM Contributors to disease/status Marijuana - discussed cessation.  SCD  Assessment - SCD risk  estimated to be 4.32% at 7 years SDM: we have discussed POET testing, no plans for ICD currently, but we will use this as a scaffold for exercise start; discussed weight lifting limitations around LVOT obstruction  Atrial fibrillation Assessment  - HCM-AF score 18  Medication symptom plan - Mildly symptomatic with shortness of breath, chest pain, and fatigue, exacerbated by physical exertion, large meals, and dehydration. Family history suggests gene-elusive hypertrophic cardiomyopathy. Current management is conservative due to low blood pressure and potential adverse effects of beta blockers and calcium channel blockers. Disopyramide is considered if symptoms worsen, with cardiac myosin inhibitors as a secondary option. Surgical options include alcohol septal ablation and myectomy, but these are not currently indicated due to mild symptoms. - Ordered stress test in March to evaluate for exercise-induced syncope, ventricular tachycardia, or hypotension. - Scheduled follow-up in six months. - If  symptoms worsen, will initiate disopyramide and consider cardiac myosin inhibitors ( I would preferentially pick Aficamen for him with possible marijuana interaction)  Time:   I have spent a total of 48 minutes with the patient reviewing notes, imaging, EKGs, labs,  and examining the patient as well as establishing an assessment and plan that was discussed personally with the patient. Discussed disease state education , using shared decision making tools and cardiac modeling, exercise, genetic testing, family planning.  Stanly Leavens, MD FASE [facility] Cardiologist [facility]  [address] [place], [address] [place], [place] [address] [phone]  10:31 AM

## 2024-10-04 ENCOUNTER — Ambulatory Visit: Payer: Self-pay | Admitting: Internal Medicine

## 2024-11-02 ENCOUNTER — Encounter (HOSPITAL_COMMUNITY): Payer: Self-pay
